# Patient Record
Sex: Male | Born: 1950
Health system: Southern US, Community
[De-identification: ages and names within clinical notes are randomized; demographics above are authoritative.]

## PROBLEM LIST (undated history)

## (undated) DIAGNOSIS — N529 Male erectile dysfunction, unspecified: Secondary | ICD-10-CM

## (undated) DIAGNOSIS — E785 Hyperlipidemia, unspecified: Secondary | ICD-10-CM

## (undated) DIAGNOSIS — K219 Gastro-esophageal reflux disease without esophagitis: Secondary | ICD-10-CM

## (undated) DIAGNOSIS — Z952 Presence of prosthetic heart valve: Secondary | ICD-10-CM

## (undated) DIAGNOSIS — G4733 Obstructive sleep apnea (adult) (pediatric): Secondary | ICD-10-CM

## (undated) DIAGNOSIS — K642 Third degree hemorrhoids: Secondary | ICD-10-CM

## (undated) DIAGNOSIS — I509 Heart failure, unspecified: Secondary | ICD-10-CM

## (undated) DIAGNOSIS — I4821 Permanent atrial fibrillation: Secondary | ICD-10-CM

## (undated) DIAGNOSIS — Z9989 Dependence on other enabling machines and devices: Secondary | ICD-10-CM

## (undated) DIAGNOSIS — I10 Essential (primary) hypertension: Secondary | ICD-10-CM

## (undated) HISTORY — PX: MITRAL VALVE REPAIR: SHX2039

## (undated) HISTORY — PX: MITRAL VALVE REPLACEMENT: SHX147

## (undated) HISTORY — PX: CARDIOVASCULAR STRESS TEST: SHX262

## (undated) HISTORY — DX: Male erectile dysfunction, unspecified: N52.9

## (undated) HISTORY — DX: Presence of prosthetic heart valve: Z95.2

## (undated) HISTORY — PX: KNEE ARTHROSCOPY: SUR90

## (undated) HISTORY — DX: Essential (primary) hypertension: I10

## (undated) HISTORY — DX: Gastro-esophageal reflux disease without esophagitis: K21.9

## (undated) HISTORY — PX: OTHER SURGICAL HISTORY: SHX169

## (undated) HISTORY — PX: TRANSTHORACIC ECHOCARDIOGRAM: SHX275

---
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
## 2001-05-03 HISTORY — PX: CARDIAC CATHETERIZATION: SHX172

## 2004-10-17 HISTORY — PX: CARDIOVERSION: SHX1299

## 2009-10-17 HISTORY — PX: COLONOSCOPY: SHX174

## 2011-03-31 LAB — COMPREHENSIVE METABOLIC PANEL
AST: 22 U/L
Glucose: 88
Potassium: 4.4 mmol/L
Sodium: 141 mmol/L (ref 137–147)

## 2011-03-31 LAB — LIPID PANEL
Cholesterol, Total: 179
HDL: 60 mg/dL (ref 35–70)
Triglycerides: 104

## 2011-03-31 LAB — PSA: PSA: 1.2 ng/mL

## 2011-03-31 LAB — CBC: platelet count: 263

## 2011-03-31 LAB — TSH: TSH: 2.05

## 2011-06-24 LAB — PROTIME-INR

## 2011-06-30 ENCOUNTER — Encounter: Payer: Self-pay | Admitting: Cardiology

## 2011-06-30 NOTE — Telephone Encounter (Signed)
Pt calling wanting to know what anti-acids pt can take w/ blood thinner. Please return pt call to discuss further.

## 2011-07-01 ENCOUNTER — Telehealth: Payer: Self-pay | Admitting: Cardiology

## 2011-07-01 ENCOUNTER — Emergency Department: Payer: Self-pay | Admitting: Emergency Medicine

## 2011-07-01 NOTE — Telephone Encounter (Signed)
Patient needs to see his primary care about abdominal pain Ian Bowen

## 2011-07-01 NOTE — Telephone Encounter (Signed)
Pt calling re new pt appt with [facility] 9-25, wants appt sooner, says having sob and pain in abdomen, dx shows ulcer in stomach and assuming that's where the pain is coming from I asked if any chest pain, said no, having trouble catching breath due to the pain, should he even be seen for this here???? Pt requesting call

## 2011-07-01 NOTE — Telephone Encounter (Signed)
MR Ian Bowen--a new pt who will see dr Jens Som 9/25 is calling stating he is having belly pain associated with bloating and SOB --He states he thinks it may be ulcer and what is best PPI for him to take OTC--i spoke with sally and she recommended pt take prilosec--this information given to pt--advised dr Jens Som does not have sooner appoint and since this will be first visit i felt he should see dr Jens Som for this appoint--i also asked sched to keep an eye on appoint list to see if any of dr Ludwig Clarks pts cancel--mr Strout can take that spot--mr Therriault agrees--in meantime if symptoms become worse he should proceed to nearest ED--pt agrees and michelle in sched will keep eye open for a cancelation

## 2011-07-04 NOTE — Telephone Encounter (Signed)
Spoke with pt, he was seen in the [place] ER with atrial fib with RVR. He was started on furosemide, potassium and diltiazem. He is feeling better since these meds were started. He has a hx of a. Fib after his valve surgery but was cardioverted and has remained in sinus since then(2005). Pt is leaving to go out of town wed this week. Pt aware dr Jens Som is not here before he would leave town and pt only wants to see dr Jens Som. Therefore we will get his records from [facility] when he was senn and I will discuss with dr Jens Som on wed morning. Pt is agreeable with this plan Deliah Goody

## 2011-07-04 NOTE — Telephone Encounter (Signed)
PT SEEN IN ER Friday NIGHT, SAID THEY WERE GOING TO CALL TO SAY HE NEEDED TO BE SEEN SOONER THAN 9-25, HEARTRATE 150 ON FIRDAY PM,  HAVING A-FIB SINCE Friday, pls call

## 2011-07-08 NOTE — Telephone Encounter (Signed)
Spoke with pt, dr Jens Som reviewed records from [facility]. Pt to follow up as scheduled Deliah Goody

## 2011-07-11 ENCOUNTER — Encounter: Payer: Self-pay | Admitting: Cardiology

## 2011-07-11 ENCOUNTER — Encounter: Payer: Self-pay | Admitting: *Deleted

## 2011-07-12 ENCOUNTER — Ambulatory Visit (INDEPENDENT_AMBULATORY_CARE_PROVIDER_SITE_OTHER): Payer: BC Managed Care – PPO | Admitting: *Deleted

## 2011-07-12 ENCOUNTER — Other Ambulatory Visit: Payer: Self-pay | Admitting: Cardiology

## 2011-07-12 ENCOUNTER — Ambulatory Visit (INDEPENDENT_AMBULATORY_CARE_PROVIDER_SITE_OTHER): Payer: BC Managed Care – PPO | Admitting: Cardiology

## 2011-07-12 ENCOUNTER — Encounter: Payer: Self-pay | Admitting: Cardiology

## 2011-07-12 ENCOUNTER — Ambulatory Visit (HOSPITAL_COMMUNITY): Payer: BC Managed Care – PPO | Attending: Cardiology | Admitting: Radiology

## 2011-07-12 DIAGNOSIS — I059 Rheumatic mitral valve disease, unspecified: Secondary | ICD-10-CM

## 2011-07-12 DIAGNOSIS — Z952 Presence of prosthetic heart valve: Secondary | ICD-10-CM

## 2011-07-12 DIAGNOSIS — R0989 Other specified symptoms and signs involving the circulatory and respiratory systems: Secondary | ICD-10-CM | POA: Insufficient documentation

## 2011-07-12 DIAGNOSIS — I4891 Unspecified atrial fibrillation: Secondary | ICD-10-CM

## 2011-07-12 DIAGNOSIS — I079 Rheumatic tricuspid valve disease, unspecified: Secondary | ICD-10-CM | POA: Insufficient documentation

## 2011-07-12 DIAGNOSIS — I379 Nonrheumatic pulmonary valve disorder, unspecified: Secondary | ICD-10-CM | POA: Insufficient documentation

## 2011-07-12 DIAGNOSIS — I10 Essential (primary) hypertension: Secondary | ICD-10-CM

## 2011-07-12 DIAGNOSIS — R1013 Epigastric pain: Secondary | ICD-10-CM

## 2011-07-12 DIAGNOSIS — M7989 Other specified soft tissue disorders: Secondary | ICD-10-CM | POA: Insufficient documentation

## 2011-07-12 DIAGNOSIS — I5031 Acute diastolic (congestive) heart failure: Secondary | ICD-10-CM

## 2011-07-12 DIAGNOSIS — I509 Heart failure, unspecified: Secondary | ICD-10-CM

## 2011-07-12 DIAGNOSIS — Z7901 Long term (current) use of anticoagulants: Secondary | ICD-10-CM

## 2011-07-12 DIAGNOSIS — Z954 Presence of other heart-valve replacement: Secondary | ICD-10-CM

## 2011-07-12 DIAGNOSIS — E785 Hyperlipidemia, unspecified: Secondary | ICD-10-CM | POA: Insufficient documentation

## 2011-07-12 DIAGNOSIS — R0609 Other forms of dyspnea: Secondary | ICD-10-CM | POA: Insufficient documentation

## 2011-07-12 LAB — CBC WITH DIFFERENTIAL/PLATELET
Eosinophils Relative: 2.4 % (ref 0.0–5.0)
HCT: 47.1 % (ref 39.0–52.0)
Hemoglobin: 15.6 g/dL (ref 13.0–17.0)
Lymphs Abs: 1.2 10*3/uL (ref 0.7–4.0)
MCV: 101.5 fl — ABNORMAL HIGH (ref 78.0–100.0)
Monocytes Absolute: 0.5 10*3/uL (ref 0.1–1.0)
Neutro Abs: 4.5 10*3/uL (ref 1.4–7.7)
Platelets: 217 10*3/uL (ref 150.0–400.0)
RDW: 14.1 % (ref 11.5–14.6)
WBC: 6.4 10*3/uL (ref 4.5–10.5)

## 2011-07-12 LAB — BASIC METABOLIC PANEL
BUN: 16 mg/dL (ref 6–23)
CO2: 25 mEq/L (ref 19–32)
Chloride: 107 mEq/L (ref 96–112)
Creatinine, Ser: 0.9 mg/dL (ref 0.4–1.5)

## 2011-07-12 LAB — POCT INR: INR: 3.5

## 2011-07-12 MED ORDER — FUROSEMIDE 20 MG PO TABS: 20.0000 mg | ORAL_TABLET | Freq: Two times a day (BID) | ORAL | Status: DC

## 2011-07-12 MED ORDER — POTASSIUM CHLORIDE 10 MEQ PO TBCR: 10.0000 meq | EXTENDED_RELEASE_TABLET | Freq: Two times a day (BID) | ORAL | Status: DC

## 2011-07-12 NOTE — Patient Instructions (Addendum)
Your physician recommends that you schedule a follow-up appointment in: 1 WEEK  INCREASE FUROSEMIDE TO 20 MG ONE TABLET TWICE DAILY  Your physician recommends that you return for lab work in: TODAY AND IN ONE WEEK  INCREASE POTASSIUM 20 MEQ ONE TABLET TWICE DAILY  Your physician has requested that you have an echocardiogram. Echocardiography is a painless test that uses sound waves to create images of your heart. It provides your doctor with information about the size and shape of your heart and how well your heart's chambers and valves are working. This procedure takes approximately one hour. There are no restrictions for this procedure.   ESTABLISH WITH PRIMARY CARE AT [facility]  Your physician has requested that you have a lexiscan myoview. For further information please visit [email or web address] Please follow instruction sheet, as given.     INCREASE LISINOPRIL TO 2.5 MG ONCE DAILY

## 2011-07-12 NOTE — Assessment & Plan Note (Addendum)
Patient has new-onset congestive heart failure. He has worsening dyspnea on exertion, orthopnea and pedal edema. His symptoms did improve with the addition of Cardizem and low-dose Lasix. He still has excess volume. Check CBC, potassium and renal function. Increase Lasix to 40 mg daily and repeat bmet in one week. Plan echocardiogram to assess LV function and mitral valve. Certainly atrial fibrillation could be contributing to his heart failure symptoms. I will obtain records from his previous physician in [place]. If his atrial fibrillation is new we will proceed with cardioversion to see if this helps with his CHF symptoms. We will need to obtain records concerning his Coumadin as well before proceeding.  Addendum: Preliminary results of echocardiogram performed in the office today showed severe LV dysfunction. Etiology unclear. Proceed with Myoview screening for coronary disease. There may be a component related to alcohol use. Patient now states he consumes approximately one and 1/2-2 bottles of wine per evening. I've asked him to abstain from alcohol. Certainly atrial fibrillation with associated tachycardia could be causing tachycardia mediated cardiomyopathy. We will await records. If he has been in long-term atrial fibrillation I will plan a monitor to make sure that his rate is controlled. Otherwise we may consider attempt at cardioversion. Note the left atrium appeared to be severely enlarged on preliminary review of his echo and therefore maintaining sinus rhythm may be difficult. I have increased his lisinopril to 2.5 mg daily. Plan check potassium and renal function in one week. Followup with me in one week.

## 2011-07-12 NOTE — Progress Notes (Signed)
Addended by: Early Chars on: 07/12/2011 05:11 PM   Modules accepted: Orders

## 2011-07-12 NOTE — Assessment & Plan Note (Signed)
Continue statin. Note recent liver functions elevated. Patient consumes a bottle of wine per evening. This may be contributing to his increased liver functions. I have asked him to decrease his alcohol intake. Repeat liver functions in the future.

## 2011-07-12 NOTE — Assessment & Plan Note (Signed)
Patient in atrial fibrillation which may be relatively new in onset and contributing to heart failure. Continue Cardizem and metoprolol. Continue Coumadin. Patient will be seen in our Coumadin clinic long-term. Reviewed outside records and proceed with cardioversion if atrial fibrillation.

## 2011-07-12 NOTE — Progress Notes (Signed)
HPI: 60 year old male for establishment. Patient underwent mitral valve replacement with a [employer] bileaflet valve in September of 2002. Note preoperative cardiac catheterization he showed normal coronary arteries and normal LV function. He apparently had subsequent repair in 2006 due to paravalvular leak. Transesophageal echocardiogram in May 2009 showed normally functioning mechanical mitral valve with no leak. LV function was normal. Patient also has a history of paroxysmal atrial fibrillation. He was seen at [facility] in September of 2012 chest with an episode of atrial fibrillation. TSH was normal. LFTs were mildly elevated. Patient recently moved from [place]. Presents to establish. Patient states that he has noticed increased dyspnea on exertion for the past one month. There was also orthopnea and PND. There was also pedal edema. No chest pain, palpitations or syncope. He does have epigastric discomfort with standing; also with exertion. When he was seen at [facility] his heart rate was 150. The patient was felt to also have congestive heart failure. Cardizem was added to his medical regimen as was Lasix and potassium. There has been some improvement in his symptoms since. He was told in May by his primary care physician that he was in atrial fibrillation which apparently was new.   Current Outpatient Prescriptions  Medication Sig Dispense Refill  . aspirin 81 MG tablet Take 81 mg by mouth daily.        Marland Kitchen diltiazem (CARDIZEM CD) 180 MG 24 hr capsule Take 180 mg by mouth daily.        Marland Kitchen lovastatin (MEVACOR) 20 MG tablet Take 20 mg by mouth at bedtime.        . metoprolol (TOPROL-XL) 50 MG 24 hr tablet Take 50 mg by mouth daily.        Marland Kitchen omeprazole (PRILOSEC) 10 MG capsule Take 10 mg by mouth daily.        . tadalafil (CIALIS) 5 MG tablet Take 5 mg by mouth daily.        Marland Kitchen warfarin (COUMADIN) 7.5 MG tablet Take 7.5 mg by mouth. As directed       . furosemide (LASIX) 20 MG tablet Take 20 mg  by mouth daily.        . potassium chloride (KLOR-CON) 10 MEQ CR tablet Take 10 mEq by mouth daily.          No Known Allergies  Past Medical History  Diagnosis Date  . Dyslipidemia   . S/P MVR (mitral valve replacement)     [facility] in [place]  . Malfunction of mitral prosthetic valve     Paravalvular leak repaired in [place]  . HTN (hypertension)   . PAF (paroxysmal atrial fibrillation)   . Mitral valve prolapse     Past Surgical History  Procedure Date  . Mitral valve replacement   . Cardioversion   . Right knee surgery   . Left arthroscopic knee surgery   . Amputation finger / thumb     History   Social History  . Marital Status: Single    Spouse Name: N/A    Number of Children: 2  . Years of Education: N/A   Occupational History  .  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 Smoking status: Never Smoker   . Smokeless tobacco: Not on file  . Alcohol Use: Yes     Bottle wine per night  . Drug Use: Not on file  . Sexually Active: Not on file   Other Topics Concern  . Not on file  Social History Narrative  . No narrative on file    Family History  Problem Relation Age of Onset  . Heart attack Father     died of MI at age 50    ROS: no fevers or chills, productive cough, hemoptysis, dysphasia, odynophagia, melena, hematochezia, dysuria, hematuria, rash, seizure activity, orthopnea, PND, pedal edema, claudication. Remaining systems are negative.  Physical Exam: General:  Well developed/well nourished in NAD Skin warm/dry Patient not depressed No peripheral clubbing Back-normal HEENT-normal/normal eyelids Neck supple/normal carotid upstroke bilaterally; no bruits; no JVD; no thyromegaly chest - CTA/ normal expansion CV - Irregular/normal S1 and S2; no murmurs, rubs or gallops;  PMI nondisplaced; crisp mechanical valvel Abdomen -NT/ND, no HSM, no mass, + bowel sounds, no bruit 2+ femoral pulses, no bruits Ext-no edema, chords, 2+  DP Neuro-grossly nonfocal  ECG atrial fibrillation at a rate of 82. Anterior TWI; possible lateral MI.

## 2011-07-12 NOTE — Progress Notes (Signed)
Addended by: Freddi Starr on: 07/12/2011 03:18 PM   Modules accepted: Orders

## 2011-07-12 NOTE — Assessment & Plan Note (Signed)
Continue present blood pressure medications. 

## 2011-07-12 NOTE — Assessment & Plan Note (Signed)
Patient states there has been some improvement with Prilosec. However he also states the symptoms can increase with exertion and there is anterior T-wave inversion on electrocardiogram. Will plan myoview to exclude coronary disease. Note catheterization prior to mitral valve surgery showed normal coronary arteries but this was 10 years ago.

## 2011-07-12 NOTE — Assessment & Plan Note (Signed)
Continued SBE prophylaxis and repeat echocardiogram.

## 2011-07-13 ENCOUNTER — Other Ambulatory Visit (INDEPENDENT_AMBULATORY_CARE_PROVIDER_SITE_OTHER): Payer: BC Managed Care – PPO | Admitting: *Deleted

## 2011-07-13 ENCOUNTER — Other Ambulatory Visit: Payer: Self-pay | Admitting: *Deleted

## 2011-07-13 DIAGNOSIS — I10 Essential (primary) hypertension: Secondary | ICD-10-CM

## 2011-07-13 DIAGNOSIS — I5031 Acute diastolic (congestive) heart failure: Secondary | ICD-10-CM

## 2011-07-13 DIAGNOSIS — I509 Heart failure, unspecified: Secondary | ICD-10-CM

## 2011-07-13 DIAGNOSIS — E875 Hyperkalemia: Secondary | ICD-10-CM

## 2011-07-13 DIAGNOSIS — Z952 Presence of prosthetic heart valve: Secondary | ICD-10-CM

## 2011-07-13 DIAGNOSIS — E785 Hyperlipidemia, unspecified: Secondary | ICD-10-CM

## 2011-07-13 DIAGNOSIS — I059 Rheumatic mitral valve disease, unspecified: Secondary | ICD-10-CM

## 2011-07-13 DIAGNOSIS — I4891 Unspecified atrial fibrillation: Secondary | ICD-10-CM

## 2011-07-13 DIAGNOSIS — Z954 Presence of other heart-valve replacement: Secondary | ICD-10-CM

## 2011-07-13 DIAGNOSIS — R1013 Epigastric pain: Secondary | ICD-10-CM

## 2011-07-13 LAB — PROTIME-INR: Prothrombin Time: 36 seconds — ABNORMAL HIGH (ref 11.6–15.2)

## 2011-07-13 LAB — BASIC METABOLIC PANEL
BUN: 20 mg/dL (ref 6–23)
Chloride: 106 mEq/L (ref 96–112)
Creatinine, Ser: 1 mg/dL (ref 0.4–1.5)
Glucose, Bld: 67 mg/dL — ABNORMAL LOW (ref 70–99)
Potassium: 5.2 mEq/L — ABNORMAL HIGH (ref 3.5–5.1)

## 2011-07-14 ENCOUNTER — Telehealth: Payer: Self-pay | Admitting: Cardiology

## 2011-07-14 ENCOUNTER — Ambulatory Visit (HOSPITAL_COMMUNITY): Payer: BC Managed Care – PPO | Attending: Cardiology | Admitting: Radiology

## 2011-07-14 DIAGNOSIS — Z952 Presence of prosthetic heart valve: Secondary | ICD-10-CM

## 2011-07-14 DIAGNOSIS — I509 Heart failure, unspecified: Secondary | ICD-10-CM | POA: Insufficient documentation

## 2011-07-14 DIAGNOSIS — Z954 Presence of other heart-valve replacement: Secondary | ICD-10-CM | POA: Insufficient documentation

## 2011-07-14 DIAGNOSIS — R0789 Other chest pain: Secondary | ICD-10-CM

## 2011-07-14 DIAGNOSIS — I059 Rheumatic mitral valve disease, unspecified: Secondary | ICD-10-CM | POA: Insufficient documentation

## 2011-07-14 DIAGNOSIS — R1013 Epigastric pain: Secondary | ICD-10-CM | POA: Insufficient documentation

## 2011-07-14 DIAGNOSIS — I4891 Unspecified atrial fibrillation: Secondary | ICD-10-CM | POA: Insufficient documentation

## 2011-07-14 DIAGNOSIS — I10 Essential (primary) hypertension: Secondary | ICD-10-CM | POA: Insufficient documentation

## 2011-07-14 DIAGNOSIS — R0609 Other forms of dyspnea: Secondary | ICD-10-CM

## 2011-07-14 DIAGNOSIS — I5031 Acute diastolic (congestive) heart failure: Secondary | ICD-10-CM | POA: Insufficient documentation

## 2011-07-14 DIAGNOSIS — E785 Hyperlipidemia, unspecified: Secondary | ICD-10-CM | POA: Insufficient documentation

## 2011-07-14 MED ORDER — TECHNETIUM TC 99M TETROFOSMIN IV KIT
11.0000 | PACK | Freq: Once | INTRAVENOUS | Status: AC | PRN
  Administered 2011-07-14: 11 via INTRAVENOUS

## 2011-07-14 MED ORDER — TECHNETIUM TC 99M TETROFOSMIN IV KIT
33.0000 | PACK | Freq: Once | INTRAVENOUS | Status: AC | PRN
  Administered 2011-07-14: 33 via INTRAVENOUS

## 2011-07-14 MED ORDER — REGADENOSON 0.4 MG/5ML IV SOLN
0.4000 mg | Freq: Once | INTRAVENOUS | Status: AC
  Administered 2011-07-14: 0.4 mg via INTRAVENOUS

## 2011-07-14 NOTE — Progress Notes (Signed)
[facility] SITE 3 NUCLEAR MED [address] [place] [address] [phone]  Cardiology Nuclear Med Study  Ian Bowen is a 60 y.o. male [id number] [birth year]   Nuclear Med Background Indication for Stress Test:  Evaluation for Ischemia, 07[facility]/11 [facility] ([facility] with CP and Abnormal EKG History:  MVP and '02 Cath:normal Coronaries>MV Replacement; '06 MV Repair; 07/12/11 Echo:EF=20%; H/O atrial fibrillation Cardiac Risk Factors: Family History - CAD, Hypertension and Lipids  Symptoms:  Epigastric Pressure with and without Exertion (now 2-3/10), Dizziness, DOE, Fatigue and Nausea   Nuclear Pre-Procedure Caffeine/Decaff Intake:  None NPO After: 8:00am   Lungs:  Clear.  O2 SAT 98% on RA IV 0.9% NS with Angio Cath:  20g  IV Site: R Wrist  IV Started by:  Stanton Kidney, EMT-P  Chest Size (in):  42 Cup Size: n/a  Height: 5\' 11"  (1.803 m)  Weight:  189 lb (85.73 kg)  BMI:  Body mass index is 26.36 kg/(m^2). Tech Comments:  Toprol held > 24 hours, per patient    Nuclear Med Study 1 or 2 day study: 1 day  Stress Test Type:  Treadmill/Lexiscan  Reading MD: Cassell Clement, MD  Order Authorizing Provider:  Olga Millers, MD  Resting Radionuclide: Technetium 25m Tetrofosmin  Resting Radionuclide Dose: 11.0 mCi   Stress Radionuclide:  Technetium 77m Tetrofosmin  Stress Radionuclide Dose: 33.0 mCi           Stress Protocol Rest HR: 85 Stress HR: 141  Rest BP: 95/70 Stress BP: 121/84  Exercise Time (min): 2:00 METS: n/a   Predicted Max HR: 161 bpm % Max HR: 88.2 bpm Rate Pressure Product: 69629   Dose of Adenosine (mg):  n/a Dose of Lexiscan: 0.4 mg  Dose of Atropine (mg): n/a Dose of Dobutamine: n/a mcg/kg/min (at max HR)  Stress Test Technologist: Smiley Houseman, CMA-N  Nuclear Technologist:  Domenic Polite, CNMT     Rest Procedure:  Myocardial perfusion imaging was performed at rest 45 minutes following the intravenous administration of Technetium 53m  Tetrofosmin.  Rest ECG: Atrial Fibrilliation with nonspecific T-wave changes and occasional PVC's.  Stress Procedure:  The patient received IV Lexiscan 0.4 mg over 15-seconds with concurrent low level exercise and then Technetium 64m Tetrofosmin was injected at 30-seconds while the patient continued walking one more minute.  There were no significant changes with Lexiscan, only occasional PVC's.  Quantitative spect images were obtained after a 45-minute delay.  Stress ECG: No significant change from baseline ECG  QPS Raw Data Images:  Normal; no motion artifact; normal heart/lung ratio. Stress Images:  Large inferoapical defect. Rest Images:  Minimal partial reversibility of apical defect with persistence of inferior defect Subtraction (SDS):  These findings are consistent with inferoapical scar with minimal reversible ischemia. Transient Ischemic Dilatation (Normal <1.22):  1.01 Lung/Heart Ratio (Normal <0.45):  0.43  Quantitative Gated Spect Images QGS EDV:  269 ml QGS ESV:  221 ml QGS cine images:  Marked cardiomegaly with severe global hypokinesis QGS EF: 18%  Impression Exercise Capacity:  Lexiscan with low level exercise. BP Response:  Hypotensive blood pressure response. Clinical Symptoms:  No chest pain. ECG Impression:  No significant ST segment change suggestive of ischemia. Comparison with Prior Nuclear Study: No previous nuclear study performed  Overall Impression:  Abnormal stress nuclear study.  Severe LV systolic dysfunction with inferoapical scar with minimal reversibility.  Cassell Clement

## 2011-07-14 NOTE — Telephone Encounter (Signed)
Pt has questions about whether or not to take his Potassium pills.  Please call pt back for medical advice.

## 2011-07-14 NOTE — Telephone Encounter (Signed)
Spoke with pt, he was told not take potassium at this time Ian Bowen

## 2011-07-18 ENCOUNTER — Telehealth: Payer: Self-pay | Admitting: Cardiology

## 2011-07-18 NOTE — Telephone Encounter (Addendum)
ROI Faxed to [facility] to obtain last 4 months of Lipids  [phone]  07/18/11/km  Lipids received from [facility] sent around to [facility] 07/19/11/km

## 2011-07-18 NOTE — Telephone Encounter (Signed)
error 

## 2011-07-19 ENCOUNTER — Encounter: Payer: Self-pay | Admitting: Cardiology

## 2011-07-19 ENCOUNTER — Encounter: Payer: Self-pay | Admitting: *Deleted

## 2011-07-20 ENCOUNTER — Ambulatory Visit (INDEPENDENT_AMBULATORY_CARE_PROVIDER_SITE_OTHER): Payer: BC Managed Care – PPO | Admitting: Family Medicine

## 2011-07-20 ENCOUNTER — Other Ambulatory Visit: Payer: BC Managed Care – PPO | Admitting: *Deleted

## 2011-07-20 ENCOUNTER — Encounter: Payer: BC Managed Care – PPO | Admitting: *Deleted

## 2011-07-20 ENCOUNTER — Encounter: Payer: Self-pay | Admitting: Family Medicine

## 2011-07-20 VITALS — BP 118/72 | HR 80 | Temp 98.5°F | Ht 69.5 in | Wt 185.8 lb

## 2011-07-20 DIAGNOSIS — Z952 Presence of prosthetic heart valve: Secondary | ICD-10-CM

## 2011-07-20 DIAGNOSIS — R1013 Epigastric pain: Secondary | ICD-10-CM

## 2011-07-20 DIAGNOSIS — I10 Essential (primary) hypertension: Secondary | ICD-10-CM

## 2011-07-20 DIAGNOSIS — E785 Hyperlipidemia, unspecified: Secondary | ICD-10-CM

## 2011-07-20 DIAGNOSIS — I509 Heart failure, unspecified: Secondary | ICD-10-CM

## 2011-07-20 DIAGNOSIS — K219 Gastro-esophageal reflux disease without esophagitis: Secondary | ICD-10-CM

## 2011-07-20 DIAGNOSIS — I4891 Unspecified atrial fibrillation: Secondary | ICD-10-CM

## 2011-07-20 DIAGNOSIS — Z954 Presence of other heart-valve replacement: Secondary | ICD-10-CM

## 2011-07-20 NOTE — Progress Notes (Signed)
Subjective:    Patient ID: Ian Bowen, male    DOB: [birth year], 60 y.o.   MRN: [id number]  HPI CC: new pt establish  H/o mitral valve replaced 2002, leaking 2006 s/p repair in [place].  Recent ER visit 2 wks ago for SOB, tachycardia, fluid around heart and lungs.  On recent echo found to have sCHF with EF 20%, dilated LV, mild pHTN.  Currently no SOB, PNDyspnea, orthopnea, chest pain/tightness, dizziness, HA.  Weight tends to be 180lbs.  Lots of stress recently - short selling 3 homes, may be foreclosing 4th, going through divorce.  H/o bleeding hemorrhoids in past.  Last noted yesterday, improving.  Worse with coumadin.  Colonoscopy last year in [place], [place], normal, per pt rpt due in 10 years.  Cardiologist is Dr. Jens Som.  Preventative: Unsure last tetanus.  Flu shot - declines.  Never had pneumonia shot. Colonoscopy 2011, normal. Normal prostate screening. 03/01/2011 last CPE with blood work.  Medications and allergies reviewed and updated in chart.  Past histories reviewed and updated if relevant as below. Patient Active Problem List  Diagnoses  . CHF (congestive heart failure)  . S/P mitral valve replacement  . Atrial fibrillation  . Hypertension  . Hyperlipidemia  . Epigastric pain  . Encounter for long-term (current) use of anticoagulants   Past Medical History  Diagnosis Date  . Dyslipidemia   . S/P MVR (mitral valve replacement) [facility] in [place]  . Malfunction of mitral prosthetic valve     Paravalvular leak repaired in [place]  . HTN (hypertension)   . PAF (paroxysmal atrial fibrillation)   . Mitral valve prolapse     s/p replacement  . GERD (gastroesophageal reflux disease)    Past Surgical History  Procedure Date  . Mitral valve replacement 2002    with MVR repair 2006 for leaking valve  . Cardioversion 2006  . Right knee surgery   . Left arthroscopic knee surgery   . Amputation finger / thumb 1969    left ring finger  .  2d echo 07/12/2011    Normal LV size with severe global hypokinesis, EF 20%., mild pHTN, mechanical mitral valve functioning normally   History  Substance Use Topics  . Smoking status: Never Smoker   . Smokeless tobacco: Never Used  . Alcohol Use: No     previously wine/beer (bottle wine nightly).  stopped with results of echo   Family History  Problem Relation Age of Onset  . Heart attack Father     died of MI at age 56  . Coronary artery disease Father   . Cancer Mother     spleen cancer  . Coronary artery disease Paternal Uncle   . Coronary artery disease Paternal Grandfather   . Stroke Neg Hx   . Diabetes Neg Hx    No Known Allergies Current Outpatient Prescriptions on File Prior to Visit  Medication Sig Dispense Refill  . aspirin 81 MG tablet Take 81 mg by mouth daily.        Marland Kitchen diltiazem (CARDIZEM CD) 180 MG 24 hr capsule Take 180 mg by mouth daily.        . furosemide (LASIX) 20 MG tablet Take 1 tablet (20 mg total) by mouth 2 (two) times daily.  60 tablet  12  . lisinopril (ZESTRIL) 2.5 MG tablet Take 1 tablet (2.5 mg total) by mouth daily.      Marland Kitchen lovastatin (MEVACOR) 20 MG tablet Take 20 mg by mouth  at bedtime.        . metoprolol (TOPROL-XL) 50 MG 24 hr tablet Take 50 mg by mouth daily.        Marland Kitchen omeprazole (PRILOSEC) 10 MG capsule Take 10 mg by mouth daily.        . tadalafil (CIALIS) 5 MG tablet Take 5 mg by mouth daily.        Marland Kitchen warfarin (COUMADIN) 7.5 MG tablet Take 7.5 mg by mouth. As directed       . potassium chloride (KLOR-CON) 10 MEQ CR tablet Take 1 tablet (10 mEq total) by mouth 2 (two) times daily.  60 tablet  12   Review of Systems  Constitutional: Negative for fever, chills, activity change, appetite change, fatigue and unexpected weight change.  HENT: Negative for hearing loss and neck pain.   Eyes: Negative for visual disturbance.  Respiratory: Positive for cough and shortness of breath. Negative for chest tightness and wheezing.   Cardiovascular:  Positive for palpitations and leg swelling. Negative for chest pain.  Gastrointestinal: Positive for abdominal pain (improved with prilosec) and blood in stool (bleeding hemorrhoids). Negative for nausea, vomiting, diarrhea, constipation and abdominal distention.  Genitourinary: Negative for hematuria and difficulty urinating.  Musculoskeletal: Negative for myalgias and arthralgias.  Skin: Negative for rash.  Neurological: Negative for dizziness, seizures, syncope and headaches.  Hematological: Bruises/bleeds easily (on coumadin).  Psychiatric/Behavioral: Negative for dysphoric mood. The patient is not nervous/anxious.        Objective:   Physical Exam  Nursing note and vitals reviewed. Constitutional: He is oriented to person, place, and time. He appears well-developed and well-nourished. No distress.  HENT:  Head: Normocephalic and atraumatic.  Right Ear: External ear normal.  Left Ear: External ear normal.  Nose: Nose normal.  Mouth/Throat: Oropharynx is clear and moist. No oropharyngeal exudate.  Eyes: Conjunctivae and EOM are normal. Pupils are equal, round, and reactive to light. No scleral icterus.  Neck: Normal range of motion. Neck supple. JVD present.  Cardiovascular: Normal rate and intact distal pulses.  An irregularly irregular rhythm present.  No murmur heard. Pulses:      Radial pulses are 2+ on the right side, and 2+ on the left side.       Mechanical click  Pulmonary/Chest: Effort normal and breath sounds normal. No respiratory distress. He has no wheezes. He has no rales.  Abdominal: Soft. Bowel sounds are normal. He exhibits no distension and no mass. There is no tenderness. There is no rebound and no guarding.  Musculoskeletal: Normal range of motion. He exhibits no edema (no significant pitting edema).  Lymphadenopathy:    He has no cervical adenopathy.  Neurological: He is alert and oriented to person, place, and time.       CN grossly intact, station and gait  intact  Skin: Skin is warm and dry. No rash noted.  Psychiatric: He has a normal mood and affect. His behavior is normal. Judgment and thought content normal.          Assessment & Plan:

## 2011-07-20 NOTE — Assessment & Plan Note (Signed)
Await f/u labwork.  Have requested records from prior PCP.  Continue lovastatin.

## 2011-07-20 NOTE — Assessment & Plan Note (Signed)
Great control on current meds.   Requested records, continue current regimen.

## 2011-07-20 NOTE — Assessment & Plan Note (Signed)
Continue prilosec

## 2011-07-20 NOTE — Assessment & Plan Note (Signed)
In afib today, rate controlled with addition of cardizem. Pt using B blocker in am, CCB at night. Continue regimen per cards.

## 2011-07-20 NOTE — Assessment & Plan Note (Signed)
On coumadin 

## 2011-07-20 NOTE — Assessment & Plan Note (Signed)
Recent myoview negative for ischemia. Likely gerd.  improved on low dose prilosec.  continue.

## 2011-07-20 NOTE — Assessment & Plan Note (Addendum)
Systolic CHF with EF 20%, dilated CM.  ?EtOH contribution.  Pt has stopped all EtOH for last several weeks. Improved edema, DOE, orthopnea, no further PNDyspnea. Continues to hold K 2/2 mild hyperkalemia last check. Continue current regimen per cards.

## 2011-07-20 NOTE — Patient Instructions (Addendum)
Good to meet you today. Return as needed or next year for physical. I will see what cardiology plan is.

## 2011-07-21 ENCOUNTER — Other Ambulatory Visit (INDEPENDENT_AMBULATORY_CARE_PROVIDER_SITE_OTHER): Payer: BC Managed Care – PPO | Admitting: *Deleted

## 2011-07-21 ENCOUNTER — Encounter: Payer: Self-pay | Admitting: Cardiology

## 2011-07-21 ENCOUNTER — Ambulatory Visit (INDEPENDENT_AMBULATORY_CARE_PROVIDER_SITE_OTHER): Payer: BC Managed Care – PPO | Admitting: Cardiology

## 2011-07-21 DIAGNOSIS — I4891 Unspecified atrial fibrillation: Secondary | ICD-10-CM

## 2011-07-21 DIAGNOSIS — I10 Essential (primary) hypertension: Secondary | ICD-10-CM

## 2011-07-21 DIAGNOSIS — I429 Cardiomyopathy, unspecified: Secondary | ICD-10-CM | POA: Insufficient documentation

## 2011-07-21 DIAGNOSIS — E875 Hyperkalemia: Secondary | ICD-10-CM

## 2011-07-21 LAB — BASIC METABOLIC PANEL
CO2: 26 mEq/L (ref 19–32)
Calcium: 8.8 mg/dL (ref 8.4–10.5)
Creatinine, Ser: 1.1 mg/dL (ref 0.4–1.5)
GFR: 72.67 mL/min (ref >60.00)
Glucose, Bld: 73 mg/dL (ref 70–99)
Sodium: 138 mEq/L (ref 135–145)

## 2011-07-21 MED ORDER — DIGOXIN 125 MCG PO TABS: 125.0000 ug | ORAL_TABLET | Freq: Every day | ORAL | Status: DC

## 2011-07-21 MED ORDER — METOPROLOL SUCCINATE ER 50 MG PO TB24: 50.0000 mg | ORAL_TABLET | Freq: Two times a day (BID) | ORAL | Status: DC

## 2011-07-21 NOTE — Assessment & Plan Note (Signed)
Volume status much improved. Check potassium and renal function.

## 2011-07-21 NOTE — Assessment & Plan Note (Signed)
Continue present blood pressure medications. 

## 2011-07-21 NOTE — Patient Instructions (Addendum)
Your physician has recommended that you wear a 48 hour holter monitor. Holter monitors are medical devices that record the heart's electrical activity. Doctors most often use these monitors to diagnose arrhythmias. Arrhythmias are problems with the speed or rhythm of the heartbeat. The monitor is a small, portable device. You can wear one while you do your normal daily activities. This is usually used to diagnose what is causing palpitations/syncope (passing out).   Your physician has recommended you make the following change in your medication:  Increase Toprol Start Digoxin Stop Cardizem  Your physician recommends that you schedule a follow-up appointment in: 2 weeks with Dr. Jens Som  Your physician recommends that you have lab work today  Bmp

## 2011-07-21 NOTE — Assessment & Plan Note (Signed)
Duration unclear but he was told he was in atrial fibrillation in May of 2012. His left atrium is enlarged. He will also require Coumadin for his mitral valve replacement. Therefore plan rate control and anticoagulations.

## 2011-07-21 NOTE — Assessment & Plan Note (Signed)
Continued SBE prophylaxis. 

## 2011-07-21 NOTE — Progress Notes (Signed)
HPI:60 year old male for fu of CHF and atrial fibrilllation. Patient underwent mitral valve replacement with a [employer] bileaflet valve in September of 2002. Note preoperative cardiac catheterization showed normal coronary arteries and normal LV function. He apparently had subsequent repair in 2006 due to paravalvular leak. Transesophageal echocardiogram in May 2009 showed normally functioning mechanical mitral valve with no leak. LV function was normal. Patient also has a history of paroxysmal atrial fibrillation. He was seen at [facility] in September of 2012 with an episode of atrial fibrillation. TSH was normal. LFTs were mildly elevated.  When he was seen at [facility] his heart rate was 150. The patient was felt to also have congestive heart failure. Cardizem was added to his medical regimen as was Lasix and potassium. There has been some improvement in his symptoms. When I saw him previously we increased his Lasix. A Myoview was performed in September of 2012 and showed inferior apical infarct with minimal reversibility. Ejection fraction 18%. Echocardiogram in September of 2012 showed an ejection fraction of 20%, diffuse hypokinesis, normally functioning prosthetic mitral valve, biatrial enlargement, right ventricular enlargement with decreased function. Since I last saw him, his dyspnea is much improved. No dyspnea on exertion, orthopnea, PND, pedal edema, syncope or chest pain. No palpitations.   Current Outpatient Prescriptions  Medication Sig Dispense Refill  . aspirin 81 MG tablet Take 81 mg by mouth daily.        Marland Kitchen diltiazem (CARDIZEM CD) 180 MG 24 hr capsule Take 180 mg by mouth daily.        . furosemide (LASIX) 20 MG tablet Take 1 tablet (20 mg total) by mouth 2 (two) times daily.  60 tablet  12  . lisinopril (ZESTRIL) 2.5 MG tablet Take 1 tablet (2.5 mg total) by mouth daily.      Marland Kitchen lovastatin (MEVACOR) 20 MG tablet Take 20 mg by mouth at bedtime.        . metoprolol (TOPROL-XL)  50 MG 24 hr tablet Take 50 mg by mouth daily.        Marland Kitchen omeprazole (PRILOSEC) 10 MG capsule Take 10 mg by mouth daily.        . tadalafil (CIALIS) 5 MG tablet Take 5 mg by mouth daily.        Marland Kitchen warfarin (COUMADIN) 7.5 MG tablet Take 7.5 mg by mouth. As directed          Past Medical History  Diagnosis Date  . Dyslipidemia   . S/P MVR (mitral valve replacement) [facility] in [place]  . Malfunction of mitral prosthetic valve     Paravalvular leak repaired in [place]  . HTN (hypertension)   . PAF (paroxysmal atrial fibrillation)   . Mitral valve prolapse     s/p replacement  . GERD (gastroesophageal reflux disease)     Past Surgical History  Procedure Date  . Mitral valve replacement 2002    with MVR repair 2006 for leaking valve  . Cardioversion 2006  . Right knee surgery   . Left arthroscopic knee surgery   . Amputation finger / thumb 1969    left ring finger  . 2d echo 07/12/2011    Normal LV size with severe global hypokinesis, EF 20%., mild pHTN, mechanical mitral valve functioning normally    History   Social History  . Marital Status: Single    Spouse Name: N/A    Number of Children: 2  . Years of Education: N/A   Occupational History  .  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 Smoking status: Never Smoker   . Smokeless tobacco: Never Used  . Alcohol Use: No     previously wine/beer (bottle wine nightly).  stopped with results of echo  . Drug Use: No  . Sexually Active: Not on file   Other Topics Concern  . 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    ROS: no fevers or chills, productive cough, hemoptysis, dysphasia, odynophagia, melena, hematochezia, dysuria, hematuria, rash, seizure activity, orthopnea, PND, pedal edema, claudication. Remaining systems are  negative.  Physical Exam: Well-developed well-nourished in no acute distress.  Skin is warm and dry.  HEENT is normal.  Neck is supple. No thyromegaly.  Chest is clear to auscultation with normal expansion.  Cardiovascular exam is irregular, crisp valve sound. Abdominal exam nontender or distended. No masses palpated. Extremities show no edema. neuro grossly intact

## 2011-07-21 NOTE — Assessment & Plan Note (Signed)
Continue statin. 

## 2011-07-21 NOTE — Assessment & Plan Note (Signed)
Monitored in the Coumadin clinic.

## 2011-07-21 NOTE — Assessment & Plan Note (Signed)
Patient has a new cardiomyopathy with etiology unclear. I have reviewed his Myoview. A prior inferior apical infarct cannot be excluded. However the other walls are perfused normally. I think it is more consistent with a nonischemic cardiomyopathy. He has discontinued his alcohol use which could have contributed. I think more likely that atrial fibrillation with elevated heart rate caused a tachycardia mediated cardiomyopathy. His rate has improved. However given his reduced LV function I will change his Toprol to 50 mg p.o. B.i.d. And add digoxin 0.125 mg daily. Discontinue Cardizem. Place CardioNet monitor heart rate. We will adjust his regimen to make sure that his rate is controlled. We will then plan repeat echocardiogram in 3 months. Hopefully his LV function will have normalized. If not he may require cardiac catheterization and consideration of ICD.

## 2011-07-22 ENCOUNTER — Encounter (INDEPENDENT_AMBULATORY_CARE_PROVIDER_SITE_OTHER): Payer: BC Managed Care – PPO

## 2011-07-22 DIAGNOSIS — I4891 Unspecified atrial fibrillation: Secondary | ICD-10-CM

## 2011-07-25 ENCOUNTER — Other Ambulatory Visit (HOSPITAL_COMMUNITY): Payer: BC Managed Care – PPO | Admitting: Radiology

## 2011-07-25 ENCOUNTER — Other Ambulatory Visit: Payer: BC Managed Care – PPO | Admitting: *Deleted

## 2011-07-27 ENCOUNTER — Telehealth: Payer: Self-pay | Admitting: *Deleted

## 2011-07-27 DIAGNOSIS — I4891 Unspecified atrial fibrillation: Secondary | ICD-10-CM

## 2011-07-27 MED ORDER — METOPROLOL SUCCINATE ER 50 MG PO TB24: ORAL_TABLET | ORAL | Status: DC

## 2011-07-27 NOTE — Telephone Encounter (Signed)
Spoke with pt and gave him monitor results and Dr. Ludwig Clarks instructions to increase Toprol to 75 mg by mouth twice daily.  Pt verbalizes understanding of instructions.  Will send new [facility] [place].

## 2011-08-01 ENCOUNTER — Telehealth: Payer: Self-pay | Admitting: Cardiology

## 2011-08-01 NOTE — Telephone Encounter (Signed)
Spoke with pt, aware of up coming appt Ian Bowen

## 2011-08-01 NOTE — Telephone Encounter (Signed)
Pt called about any new appts he needs please call

## 2011-08-04 ENCOUNTER — Encounter: Payer: Self-pay | Admitting: Cardiology

## 2011-08-04 NOTE — Progress Notes (Signed)
HPI: Pleasant male for fu of CHF and atrial fibrilllation. Patient underwent mitral valve replacement with a [employer] bileaflet valve in September of 2002. Note preoperative cardiac catheterization showed normal coronary arteries and normal LV function. He apparently had subsequent repair in 2006 due to paravalvular leak. Transesophageal echocardiogram in May 2009 showed normally functioning mechanical mitral valve with no leak. LV function was normal. Patient also has a history of paroxysmal atrial fibrillation. He was seen at [facility] in September of 2012 with an episode of atrial fibrillation. TSH was normal. LFTs were mildly elevated. When he was seen at [facility] his heart rate was 150. The patient was felt to also have congestive heart failure. Cardizem was added to his medical regimen as was Lasix and potassium. A Myoview was performed in September of 2012 and showed inferior apical infarct with minimal reversibility. Ejection fraction 18%. Echocardiogram in September of 2012 showed an ejection fraction of 20%, diffuse hypokinesis, normally functioning prosthetic mitral valve, biatrial enlargement, right ventricular enlargement with decreased function. We felt his cardiomyopathy may be tachycardia mediated vs ETOH. Holter in Oct 2012 showed his rate mildly increased and we increased his toprol. Since I last saw him, his dyspnea is much improved. No dyspnea on exertion, orthopnea, PND, pedal edema, syncope or chest pain. No palpitations.   Current Outpatient Prescriptions  Medication Sig Dispense Refill  . aspirin 81 MG tablet Take 81 mg by mouth daily.        . digoxin (LANOXIN) 0.125 MG tablet Take 1 tablet (125 mcg total) by mouth daily.  30 tablet  6  . furosemide (LASIX) 20 MG tablet Take 1 tablet (20 mg total) by mouth 2 (two) times daily.  60 tablet  12  . lisinopril (ZESTRIL) 2.5 MG tablet Take 1 tablet (2.5 mg total) by mouth daily.      Marland Kitchen lovastatin (MEVACOR) 20 MG tablet Take 20  mg by mouth at bedtime.        . metoprolol (TOPROL-XL) 50 MG 24 hr tablet Take one and one half tablets by mouth twice daily  90 tablet  6  . omeprazole (PRILOSEC) 10 MG capsule Take 10 mg by mouth daily.        . tadalafil (CIALIS) 5 MG tablet Take 5 mg by mouth daily as needed.       . warfarin (COUMADIN) 7.5 MG tablet Take 7.5 mg by mouth. As directed          Past Medical History  Diagnosis Date  . Dyslipidemia   . S/P MVR (mitral valve replacement) [facility] in [place]  . Malfunction of mitral prosthetic valve     Paravalvular leak repaired in [place]  . HTN (hypertension)   . PAF (paroxysmal atrial fibrillation)   . Mitral valve prolapse     s/p replacement  . GERD (gastroesophageal reflux disease)     Past Surgical History  Procedure Date  . Mitral valve replacement 2002    with MVR repair 2006 for leaking valve  . Cardioversion 2006  . Right knee surgery   . Left arthroscopic knee surgery   . Amputation finger / thumb 1969    left ring finger  . 2d echo 07/12/2011    Normal LV size with severe global hypokinesis, EF 20%., mild pHTN, mechanical mitral valve functioning normally    History   Social History  . Marital Status: Single    Spouse Name: N/A    Number of Children: 2  .  Years of Education: N/A   Occupational History  .  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 Smoking status: Never Smoker   . Smokeless tobacco: Never Used  . Alcohol Use: No     previously wine/beer (bottle wine nightly).  stopped with results of echo  . Drug Use: No  . Sexually Active: Not on file   Other Topics Concern  . 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    ROS: no fevers or chills, productive cough, hemoptysis, dysphasia, odynophagia, melena, hematochezia, dysuria, hematuria,  rash, seizure activity, orthopnea, PND, pedal edema, claudication. Remaining systems are negative.  Physical Exam: Well-developed well-nourished in no acute distress.  Skin is warm and dry.  HEENT is normal.  Neck is supple. No thyromegaly.  Chest is clear to auscultation with normal expansion.  Cardiovascular exam is bradycardic and irregular. Abdominal exam nontender or distended. No masses palpated. Extremities show no edema. neuro grossly intact  ECG atrial fibrillation at a rate of 51. Nonspecific T-wave changes.

## 2011-08-05 ENCOUNTER — Encounter: Payer: Self-pay | Admitting: Cardiology

## 2011-08-05 ENCOUNTER — Ambulatory Visit (INDEPENDENT_AMBULATORY_CARE_PROVIDER_SITE_OTHER): Payer: BC Managed Care – PPO | Admitting: Cardiology

## 2011-08-05 DIAGNOSIS — I5031 Acute diastolic (congestive) heart failure: Secondary | ICD-10-CM

## 2011-08-05 DIAGNOSIS — E785 Hyperlipidemia, unspecified: Secondary | ICD-10-CM

## 2011-08-05 DIAGNOSIS — I059 Rheumatic mitral valve disease, unspecified: Secondary | ICD-10-CM

## 2011-08-05 DIAGNOSIS — R1013 Epigastric pain: Secondary | ICD-10-CM

## 2011-08-05 DIAGNOSIS — I10 Essential (primary) hypertension: Secondary | ICD-10-CM

## 2011-08-05 DIAGNOSIS — I509 Heart failure, unspecified: Secondary | ICD-10-CM

## 2011-08-05 DIAGNOSIS — Z952 Presence of prosthetic heart valve: Secondary | ICD-10-CM

## 2011-08-05 DIAGNOSIS — I4891 Unspecified atrial fibrillation: Secondary | ICD-10-CM

## 2011-08-05 NOTE — Assessment & Plan Note (Signed)
We'll continue SBE prophylaxis.

## 2011-08-05 NOTE — Assessment & Plan Note (Signed)
Blood pressure controlled. Continue present medications. 

## 2011-08-05 NOTE — Assessment & Plan Note (Signed)
Patient euvolemic on examination. Change Lasix to 20 mg daily with an additional 20 mg daily as needed.

## 2011-08-05 NOTE — Assessment & Plan Note (Signed)
Continue statin. 

## 2011-08-05 NOTE — Assessment & Plan Note (Signed)
Patient remains in atrial fibrillation. Given that he will require Coumadin long-term I have elected to treat with rate control and coagulation. His rate is mildly reduced. Continue Toprol but discontinue digoxin.

## 2011-08-05 NOTE — Assessment & Plan Note (Signed)
I think the patient's cardiomyopathy is most likely tachycardia mediated. His rate is now well controlled. Continue ACE inhibitor and beta blocker. Repeat echocardiogram in 3 months. Hopefully his LV function will have improved. If ejection fraction less than or equal to 35% he may need ICD.

## 2011-08-05 NOTE — Patient Instructions (Signed)
Your physician wants you to follow-up in: 3 MONTHS You will receive a reminder letter in the mail two months in advance. If you don't receive a letter, please call our office to schedule the follow-up appointment.   STOP DIGOXIN  DECREASE FUROSEMIDE 20 MG ONCE DAILY AND AS NEEDED FOR SOB AND SWELLING  Your physician has requested that you have an echocardiogram. Echocardiography is a painless test that uses sound waves to create images of your heart. It provides your doctor with information about the size and shape of your heart and how well your heart's chambers and valves are working. This procedure takes approximately one hour. There are no restrictions for this procedure.PRIOR TO FOLLOW UP APPT

## 2011-08-08 ENCOUNTER — Other Ambulatory Visit: Payer: Self-pay | Admitting: *Deleted

## 2011-08-08 MED ORDER — WARFARIN SODIUM 5 MG PO TABS: 5.0000 mg | ORAL_TABLET | Freq: Every day | ORAL | Status: DC

## 2011-08-09 ENCOUNTER — Ambulatory Visit (INDEPENDENT_AMBULATORY_CARE_PROVIDER_SITE_OTHER): Payer: BC Managed Care – PPO | Admitting: *Deleted

## 2011-08-09 DIAGNOSIS — Z7901 Long term (current) use of anticoagulants: Secondary | ICD-10-CM

## 2011-08-09 DIAGNOSIS — Z952 Presence of prosthetic heart valve: Secondary | ICD-10-CM

## 2011-08-09 DIAGNOSIS — I4891 Unspecified atrial fibrillation: Secondary | ICD-10-CM

## 2011-08-17 NOTE — Progress Notes (Signed)
Addended by: Kem Parkinson on: 08/17/2011 10:55 AM   Modules accepted: Orders

## 2011-08-21 ENCOUNTER — Encounter: Payer: Self-pay | Admitting: Family Medicine

## 2011-08-22 ENCOUNTER — Encounter: Payer: Self-pay | Admitting: Family Medicine

## 2011-08-31 ENCOUNTER — Encounter: Payer: Self-pay | Admitting: Cardiology

## 2011-09-01 ENCOUNTER — Encounter: Payer: Self-pay | Admitting: Cardiology

## 2011-09-02 ENCOUNTER — Other Ambulatory Visit: Payer: Self-pay | Admitting: *Deleted

## 2011-09-05 ENCOUNTER — Ambulatory Visit (INDEPENDENT_AMBULATORY_CARE_PROVIDER_SITE_OTHER): Payer: BC Managed Care – PPO | Admitting: *Deleted

## 2011-09-05 DIAGNOSIS — I4891 Unspecified atrial fibrillation: Secondary | ICD-10-CM

## 2011-09-05 DIAGNOSIS — Z7901 Long term (current) use of anticoagulants: Secondary | ICD-10-CM

## 2011-09-05 DIAGNOSIS — Z952 Presence of prosthetic heart valve: Secondary | ICD-10-CM

## 2011-09-05 LAB — POCT INR: INR: 3.8

## 2011-09-07 ENCOUNTER — Other Ambulatory Visit: Payer: Self-pay

## 2011-09-09 ENCOUNTER — Telehealth: Payer: Self-pay | Admitting: *Deleted

## 2011-09-09 ENCOUNTER — Other Ambulatory Visit: Payer: Self-pay | Admitting: *Deleted

## 2011-09-09 NOTE — Telephone Encounter (Signed)
[facility] for pt diltiazem er 180 mg dsily. Not listed on current med list, do not see pt has been in the hosp. Left message for pt to call to discuss Ian Bowen

## 2011-09-09 NOTE — Telephone Encounter (Signed)
Spoke with pt, he is no longer taking diltiazem Deliah Goody

## 2011-09-23 ENCOUNTER — Telehealth: Payer: Self-pay | Admitting: Internal Medicine

## 2011-09-23 ENCOUNTER — Ambulatory Visit (INDEPENDENT_AMBULATORY_CARE_PROVIDER_SITE_OTHER)
Admission: RE | Admit: 2011-09-23 | Discharge: 2011-09-23 | Disposition: A | Discharge status: Home / Self Care | Payer: BC Managed Care – PPO | Source: Ambulatory Visit | Attending: Family Medicine | Admitting: Family Medicine

## 2011-09-23 ENCOUNTER — Encounter: Payer: Self-pay | Admitting: Family Medicine

## 2011-09-23 ENCOUNTER — Ambulatory Visit (INDEPENDENT_AMBULATORY_CARE_PROVIDER_SITE_OTHER): Payer: BC Managed Care – PPO | Admitting: Family Medicine

## 2011-09-23 VITALS — BP 122/80 | HR 80 | Temp 98.2°F | Wt 186.2 lb

## 2011-09-23 DIAGNOSIS — S99911A Unspecified injury of right ankle, initial encounter: Secondary | ICD-10-CM | POA: Insufficient documentation

## 2011-09-23 DIAGNOSIS — S8990XA Unspecified injury of unspecified lower leg, initial encounter: Secondary | ICD-10-CM

## 2011-09-23 DIAGNOSIS — S99929A Unspecified injury of unspecified foot, initial encounter: Secondary | ICD-10-CM

## 2011-09-23 MED ORDER — HYDROCODONE-ACETAMINOPHEN 5-500 MG PO TABS: 1.0000 | ORAL_TABLET | ORAL | Status: DC | PRN

## 2011-09-23 NOTE — Patient Instructions (Addendum)
Placed in ASO brace during day, may take off at night. Use tylenol (acetaminophen) 500mg  three times daily, use vicodin 1 pill three times daily as needed for breakthrough pain. Ice ankle and elevate leg as much as you can. Continue crutches for 1 wk. Return 12/20 for recheck, sooner if not improving as expected. Ankle stretching/strengthening exercises provided today.

## 2011-09-23 NOTE — Telephone Encounter (Signed)
Patient wanted to know if you would like for him to come in early to get xray on his ankle before his appointment at 3:15

## 2011-09-23 NOTE — Telephone Encounter (Signed)
No we will take a look when comes in.

## 2011-09-23 NOTE — Assessment & Plan Note (Addendum)
Checked xrays given some malleolar tenderness - did not see fracture. Anticipate lateral ankle sprain.   Placed in ASO brace, return in 2 wks for f/u, sooner if needed. Tylenol scheduled for pain, vicodin for breakthrough pain.  No NSAIDs 2/2 coumadin. ADDENDUM==> xray report read as possible talus and calcaneal fractures.  Called patient, advised non weight bearing over weekend, set up early next week with ortho eval for further eval/management
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
## 2011-10-03 ENCOUNTER — Ambulatory Visit (INDEPENDENT_AMBULATORY_CARE_PROVIDER_SITE_OTHER): Payer: BC Managed Care – PPO | Admitting: *Deleted

## 2011-10-03 DIAGNOSIS — I4891 Unspecified atrial fibrillation: Secondary | ICD-10-CM

## 2011-10-03 DIAGNOSIS — Z7901 Long term (current) use of anticoagulants: Secondary | ICD-10-CM

## 2011-10-03 DIAGNOSIS — Z952 Presence of prosthetic heart valve: Secondary | ICD-10-CM

## 2011-10-03 LAB — POCT INR: INR: 5.5

## 2011-10-03 NOTE — Patient Instructions (Signed)
Encouraged leafy green salad today, patient has already has his coumadin today so unable to make changes until 12/18 and 12/19.

## 2011-10-06 ENCOUNTER — Ambulatory Visit (INDEPENDENT_AMBULATORY_CARE_PROVIDER_SITE_OTHER): Payer: BC Managed Care – PPO | Admitting: Family Medicine

## 2011-10-06 ENCOUNTER — Encounter: Payer: Self-pay | Admitting: Family Medicine

## 2011-10-06 VITALS — BP 136/72 | HR 56 | Temp 97.8°F | Wt 184.5 lb

## 2011-10-06 DIAGNOSIS — S99911A Unspecified injury of right ankle, initial encounter: Secondary | ICD-10-CM

## 2011-10-06 DIAGNOSIS — S8990XA Unspecified injury of unspecified lower leg, initial encounter: Secondary | ICD-10-CM

## 2011-10-06 NOTE — Patient Instructions (Signed)
Ankle's looking ok today. Do alphabet range of motion - upper case then lower case daily., Continue stretching exercises provided 2 wks ago. ASO brace if increased activity planned. Elevate leg as much as able.  Let us know if not improving.
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
## 2011-10-06 NOTE — Assessment & Plan Note (Signed)
Anticipate continued ankle sprain.  Healing well. Possible bony contusion as well. Anticipate continued healing. Update [place] if not improving as expected. Discussed alphabet exercises with great toe for ROM. Request records from ortho.

## 2011-10-18 DIAGNOSIS — G4733 Obstructive sleep apnea (adult) (pediatric): Secondary | ICD-10-CM

## 2011-10-18 HISTORY — DX: Obstructive sleep apnea (adult) (pediatric): G47.33

## 2011-10-24 ENCOUNTER — Ambulatory Visit (INDEPENDENT_AMBULATORY_CARE_PROVIDER_SITE_OTHER): Payer: BC Managed Care – PPO | Admitting: *Deleted

## 2011-10-24 DIAGNOSIS — Z7901 Long term (current) use of anticoagulants: Secondary | ICD-10-CM

## 2011-10-24 DIAGNOSIS — I4891 Unspecified atrial fibrillation: Secondary | ICD-10-CM

## 2011-10-24 DIAGNOSIS — Z952 Presence of prosthetic heart valve: Secondary | ICD-10-CM

## 2011-10-24 LAB — POCT INR: INR: 4.2

## 2011-11-09 ENCOUNTER — Other Ambulatory Visit (HOSPITAL_COMMUNITY): Payer: Self-pay | Admitting: Cardiology

## 2011-11-09 DIAGNOSIS — I059 Rheumatic mitral valve disease, unspecified: Secondary | ICD-10-CM

## 2011-11-09 DIAGNOSIS — I519 Heart disease, unspecified: Secondary | ICD-10-CM

## 2011-11-10 ENCOUNTER — Ambulatory Visit (INDEPENDENT_AMBULATORY_CARE_PROVIDER_SITE_OTHER): Payer: BC Managed Care – PPO | Admitting: Cardiology

## 2011-11-10 ENCOUNTER — Telehealth: Payer: Self-pay | Admitting: *Deleted

## 2011-11-10 ENCOUNTER — Ambulatory Visit (HOSPITAL_COMMUNITY): Payer: BC Managed Care – PPO | Attending: Cardiology | Admitting: Radiology

## 2011-11-10 ENCOUNTER — Ambulatory Visit (INDEPENDENT_AMBULATORY_CARE_PROVIDER_SITE_OTHER): Payer: BC Managed Care – PPO | Admitting: *Deleted

## 2011-11-10 ENCOUNTER — Encounter: Payer: Self-pay | Admitting: Cardiology

## 2011-11-10 VITALS — BP 107/52 | HR 58 | Ht 70.0 in | Wt 186.0 lb

## 2011-11-10 DIAGNOSIS — E785 Hyperlipidemia, unspecified: Secondary | ICD-10-CM

## 2011-11-10 DIAGNOSIS — I4891 Unspecified atrial fibrillation: Secondary | ICD-10-CM | POA: Insufficient documentation

## 2011-11-10 DIAGNOSIS — I10 Essential (primary) hypertension: Secondary | ICD-10-CM

## 2011-11-10 DIAGNOSIS — I059 Rheumatic mitral valve disease, unspecified: Secondary | ICD-10-CM

## 2011-11-10 DIAGNOSIS — I5030 Unspecified diastolic (congestive) heart failure: Secondary | ICD-10-CM | POA: Insufficient documentation

## 2011-11-10 DIAGNOSIS — Z954 Presence of other heart-valve replacement: Secondary | ICD-10-CM | POA: Insufficient documentation

## 2011-11-10 DIAGNOSIS — Z7901 Long term (current) use of anticoagulants: Secondary | ICD-10-CM

## 2011-11-10 DIAGNOSIS — I509 Heart failure, unspecified: Secondary | ICD-10-CM

## 2011-11-10 DIAGNOSIS — Z952 Presence of prosthetic heart valve: Secondary | ICD-10-CM

## 2011-11-10 DIAGNOSIS — I519 Heart disease, unspecified: Secondary | ICD-10-CM

## 2011-11-10 DIAGNOSIS — I428 Other cardiomyopathies: Secondary | ICD-10-CM

## 2011-11-10 NOTE — Patient Instructions (Signed)
Your physician wants you to follow-up in: 6 MONTHS You will receive a reminder letter in the mail two months in advance. If you don't receive a letter, please call our office to schedule the follow-up appointment. 

## 2011-11-10 NOTE — Assessment & Plan Note (Signed)
Blood pressure controlled. Continue present medications. 

## 2011-11-10 NOTE — Assessment & Plan Note (Signed)
Euvolemic on examination. Continue present dose of Lasix. Check potassium and renal function. 

## 2011-11-10 NOTE — Assessment & Plan Note (Signed)
Continue ACE inhibitor and beta blocker. I think his previous cardiomyopathy was most likely tachycardia mediated. Echocardiogram was repeated today. Hopefully LV function will have improved. If not we will need to consider ICD.

## 2011-11-10 NOTE — Progress Notes (Signed)
[id number] male for fu of CHF and atrial fibrilllation. Patient underwent mitral valve replacement with a [employer] bileaflet valve in September of 2002. Note preoperative cardiac catheterization showed normal coronary arteries and normal LV function. He apparently had subsequent repair in 2006 due to paravalvular leak. Transesophageal echocardiogram in May 2009 showed normally functioning mechanical mitral valve with no leak. LV function was normal. Patient also has a history of paroxysmal atrial fibrillation. He was seen at [facility] in September of 2012 with an episode of atrial fibrillation. TSH was normal. LFTs were mildly elevated. When he was seen at [facility] his heart rate was 150. The patient was felt to also have congestive heart failure. Cardizem was added to his medical regimen as was Lasix and potassium. A Myoview was performed in September of 2012 and showed inferior apical infarct with minimal reversibility. Ejection fraction 18%. Echocardiogram in September of 2012 showed an ejection fraction of 20%, diffuse hypokinesis, normally functioning prosthetic mitral valve, biatrial enlargement, right ventricular enlargement with decreased function. We felt his cardiomyopathy may be tachycardia mediated vs ETOH. Holter in Oct 2012 showed his rate mildly increased and we increased his toprol. Since I last saw him in Oct of 2012, the patient denies any dyspnea on exertion, orthopnea, PND, pedal edema, palpitations, syncope or chest pain.   Current Outpatient Prescriptions  Medication Sig Dispense Refill  . aspirin 81 MG tablet Take 81 mg by mouth daily.        . furosemide (LASIX) 20 MG tablet Take 1 tablet (20 mg total) by mouth daily.      Marland Kitchen HYDROcodone-acetaminophen (VICODIN) 5-500 MG per tablet Take 1 tablet by mouth every 4 (four) hours as needed for pain.  30 tablet  0  . lisinopril (ZESTRIL) 2.5 MG tablet Take 1 tablet (2.5 mg total) by mouth daily.      Marland Kitchen lovastatin (MEVACOR)  20 MG tablet Take 20 mg by mouth at bedtime.        . metoprolol (TOPROL-XL) 50 MG 24 hr tablet Take one and one half tablets by mouth twice daily  90 tablet  6  . omeprazole (PRILOSEC) 10 MG capsule Take 10 mg by mouth daily.        . tadalafil (CIALIS) 5 MG tablet Take 5 mg by mouth daily as needed.       . warfarin (COUMADIN) 5 MG tablet Take 1 tablet (5 mg total) by mouth daily. Take up to 1.5 tablets daily or As Directed by Coumadin Clinic  45 tablet  3     Past Medical History  Diagnosis Date  . Dyslipidemia   . S/P MVR (mitral valve replacement) [facility] in [place]  . Malfunction of mitral prosthetic valve     Paravalvular leak repaired in [place]  . HTN (hypertension)   . PAF (paroxysmal atrial fibrillation)   . Mitral valve prolapse     s/p replacement  . GERD (gastroesophageal reflux disease)   . ED (erectile dysfunction)   . Cardiomyopathy     Past Surgical History  Procedure Date  . Mitral valve replacement 2002    with MVR repair 2006 for leaking valve  . Cardioversion 2006  . Right knee surgery   . Left arthroscopic knee surgery   . Amputation finger / thumb 1969    left ring finger  . 2d echo 07/12/2011    Normal LV size with severe global hypokinesis, EF 20%., mild pHTN, mechanical mitral valve functioning normally  .  Retinal surx     OD  . Colonoscopy 2011    per pt report normal    History   Social History  . Marital Status: Single    Spouse Name: N/A    Number of Children: 2  . Years of Education: N/A   Occupational History  .  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 Smoking status: Never Smoker   . Smokeless tobacco: Never Used  . Alcohol Use: No     previously wine/beer (bottle wine nightly).  stopped with results of echo  . Drug Use: No  . Sexually Active: Not on file   Other Topics Concern  . 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    ROS: no fevers or chills, productive cough, hemoptysis, dysphasia, odynophagia, melena, hematochezia, dysuria, hematuria, rash, seizure activity, orthopnea, PND, pedal edema, claudication. Remaining systems are negative.  Physical Exam: Well-developed well-nourished in no acute distress.  Skin is warm and dry.  HEENT is normal.  Neck is supple. No thyromegaly.  Chest is clear to auscultation with normal expansion.  Cardiovascular exam is irregular, crisp mechanical valve sound Abdominal exam nontender or distended. No masses palpated. Extremities show no edema. neuro grossly intact  ECG atrial fibrillation at a rate of 58. No ST changes.

## 2011-11-10 NOTE — Assessment & Plan Note (Signed)
Continue SBE prophylaxis. Repeat echocardiogram performed today.

## 2011-11-10 NOTE — Telephone Encounter (Signed)
Message copied by Freddi Starr on Thu Nov 10, 2011  3:23 PM ------      Message from: Lewayne Bunting      Created: Thu Nov 10, 2011 11:39 AM       I reviewed and EF appears to be 25-30; may be improving; repeat echo in 3 months      Olga Millers

## 2011-11-10 NOTE — Assessment & Plan Note (Signed)
Management per primary care. 

## 2011-11-10 NOTE — Assessment & Plan Note (Signed)
Continue present dose of Toprol. Continue Coumadin. He has been in atrial fibrillation for at least one year. He is also asymptomatic. He will require Coumadin for his mitral valve replacement. We will therefore not pursue cardioversion.

## 2011-11-10 NOTE — Assessment & Plan Note (Signed)
Followed in the Coumadin clinic. 

## 2011-11-11 LAB — BASIC METABOLIC PANEL
BUN: 15 mg/dL (ref 6–23)
Chloride: 106 mEq/L (ref 96–112)
GFR: 79.2 mL/min (ref >60.00)
Potassium: 4.1 mEq/L (ref 3.5–5.1)
Sodium: 142 mEq/L (ref 135–145)

## 2011-11-28 ENCOUNTER — Ambulatory Visit (INDEPENDENT_AMBULATORY_CARE_PROVIDER_SITE_OTHER): Payer: BC Managed Care – PPO | Admitting: Pharmacist

## 2011-11-28 DIAGNOSIS — Z954 Presence of other heart-valve replacement: Secondary | ICD-10-CM

## 2011-11-28 DIAGNOSIS — I4891 Unspecified atrial fibrillation: Secondary | ICD-10-CM

## 2011-11-28 DIAGNOSIS — Z7901 Long term (current) use of anticoagulants: Secondary | ICD-10-CM

## 2011-11-28 DIAGNOSIS — Z952 Presence of prosthetic heart valve: Secondary | ICD-10-CM

## 2011-11-28 LAB — POCT INR: INR: 4.3

## 2011-12-20 ENCOUNTER — Ambulatory Visit (INDEPENDENT_AMBULATORY_CARE_PROVIDER_SITE_OTHER): Payer: BC Managed Care – PPO | Admitting: *Deleted

## 2011-12-20 DIAGNOSIS — I4891 Unspecified atrial fibrillation: Secondary | ICD-10-CM

## 2011-12-20 DIAGNOSIS — Z952 Presence of prosthetic heart valve: Secondary | ICD-10-CM

## 2011-12-20 DIAGNOSIS — Z954 Presence of other heart-valve replacement: Secondary | ICD-10-CM

## 2011-12-20 DIAGNOSIS — Z7901 Long term (current) use of anticoagulants: Secondary | ICD-10-CM
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
## 2012-01-07 ENCOUNTER — Other Ambulatory Visit: Payer: Self-pay | Admitting: Cardiology

## 2012-01-18 ENCOUNTER — Ambulatory Visit (INDEPENDENT_AMBULATORY_CARE_PROVIDER_SITE_OTHER): Payer: BC Managed Care – PPO | Admitting: *Deleted

## 2012-01-18 DIAGNOSIS — I4891 Unspecified atrial fibrillation: Secondary | ICD-10-CM

## 2012-01-18 DIAGNOSIS — Z7901 Long term (current) use of anticoagulants: Secondary | ICD-10-CM

## 2012-01-18 DIAGNOSIS — Z952 Presence of prosthetic heart valve: Secondary | ICD-10-CM

## 2012-01-18 DIAGNOSIS — Z954 Presence of other heart-valve replacement: Secondary | ICD-10-CM

## 2012-01-18 LAB — POCT INR: INR: 3.2

## 2012-02-13 ENCOUNTER — Other Ambulatory Visit: Payer: Self-pay

## 2012-02-13 ENCOUNTER — Ambulatory Visit (INDEPENDENT_AMBULATORY_CARE_PROVIDER_SITE_OTHER): Payer: BC Managed Care – PPO | Admitting: *Deleted

## 2012-02-13 ENCOUNTER — Ambulatory Visit (HOSPITAL_COMMUNITY): Payer: BC Managed Care – PPO | Attending: Cardiology

## 2012-02-13 DIAGNOSIS — Z954 Presence of other heart-valve replacement: Secondary | ICD-10-CM

## 2012-02-13 DIAGNOSIS — Z7901 Long term (current) use of anticoagulants: Secondary | ICD-10-CM

## 2012-02-13 DIAGNOSIS — E785 Hyperlipidemia, unspecified: Secondary | ICD-10-CM | POA: Insufficient documentation

## 2012-02-13 DIAGNOSIS — I4891 Unspecified atrial fibrillation: Secondary | ICD-10-CM | POA: Insufficient documentation

## 2012-02-13 DIAGNOSIS — I059 Rheumatic mitral valve disease, unspecified: Secondary | ICD-10-CM | POA: Insufficient documentation

## 2012-02-13 DIAGNOSIS — Z952 Presence of prosthetic heart valve: Secondary | ICD-10-CM

## 2012-02-13 DIAGNOSIS — I517 Cardiomegaly: Secondary | ICD-10-CM | POA: Insufficient documentation

## 2012-02-13 DIAGNOSIS — I509 Heart failure, unspecified: Secondary | ICD-10-CM | POA: Insufficient documentation

## 2012-02-13 DIAGNOSIS — I519 Heart disease, unspecified: Secondary | ICD-10-CM | POA: Insufficient documentation

## 2012-02-13 DIAGNOSIS — I10 Essential (primary) hypertension: Secondary | ICD-10-CM | POA: Insufficient documentation

## 2012-02-23 ENCOUNTER — Encounter: Payer: Self-pay | Admitting: Cardiology

## 2012-02-23 ENCOUNTER — Other Ambulatory Visit: Payer: Self-pay | Admitting: Cardiology

## 2012-02-23 DIAGNOSIS — I509 Heart failure, unspecified: Secondary | ICD-10-CM

## 2012-02-23 DIAGNOSIS — Z952 Presence of prosthetic heart valve: Secondary | ICD-10-CM

## 2012-02-23 DIAGNOSIS — I5031 Acute diastolic (congestive) heart failure: Secondary | ICD-10-CM

## 2012-02-23 DIAGNOSIS — R1013 Epigastric pain: Secondary | ICD-10-CM

## 2012-02-23 DIAGNOSIS — I10 Essential (primary) hypertension: Secondary | ICD-10-CM

## 2012-02-23 DIAGNOSIS — I059 Rheumatic mitral valve disease, unspecified: Secondary | ICD-10-CM

## 2012-02-23 DIAGNOSIS — I4891 Unspecified atrial fibrillation: Secondary | ICD-10-CM

## 2012-02-23 DIAGNOSIS — E785 Hyperlipidemia, unspecified: Secondary | ICD-10-CM

## 2012-02-23 MED ORDER — WARFARIN SODIUM 5 MG PO TABS: ORAL_TABLET | ORAL | Status: DC

## 2012-02-23 MED ORDER — LOVASTATIN 20 MG PO TABS: 20.0000 mg | ORAL_TABLET | Freq: Every day | ORAL | Status: DC

## 2012-02-23 MED ORDER — METOPROLOL SUCCINATE ER 50 MG PO TB24: ORAL_TABLET | ORAL | Status: DC

## 2012-02-23 MED ORDER — LISINOPRIL 2.5 MG PO TABS: 2.5000 mg | ORAL_TABLET | Freq: Every day | ORAL | Status: DC

## 2012-02-23 MED ORDER — TADALAFIL 5 MG PO TABS: 5.0000 mg | ORAL_TABLET | Freq: Every day | ORAL | Status: DC | PRN

## 2012-03-06 ENCOUNTER — Ambulatory Visit (INDEPENDENT_AMBULATORY_CARE_PROVIDER_SITE_OTHER): Payer: BC Managed Care – PPO | Admitting: Pharmacist

## 2012-03-06 DIAGNOSIS — I4891 Unspecified atrial fibrillation: Secondary | ICD-10-CM

## 2012-03-06 DIAGNOSIS — Z7901 Long term (current) use of anticoagulants: Secondary | ICD-10-CM

## 2012-03-06 DIAGNOSIS — Z952 Presence of prosthetic heart valve: Secondary | ICD-10-CM

## 2012-03-06 DIAGNOSIS — Z954 Presence of other heart-valve replacement: Secondary | ICD-10-CM

## 2012-04-02 ENCOUNTER — Ambulatory Visit (INDEPENDENT_AMBULATORY_CARE_PROVIDER_SITE_OTHER): Payer: BC Managed Care – PPO | Admitting: *Deleted

## 2012-04-02 DIAGNOSIS — Z954 Presence of other heart-valve replacement: Secondary | ICD-10-CM

## 2012-04-02 DIAGNOSIS — Z7901 Long term (current) use of anticoagulants: Secondary | ICD-10-CM

## 2012-04-02 DIAGNOSIS — I4891 Unspecified atrial fibrillation: Secondary | ICD-10-CM

## 2012-04-02 DIAGNOSIS — Z952 Presence of prosthetic heart valve: Secondary | ICD-10-CM

## 2012-04-02 LAB — POCT INR: INR: 2.6

## 2012-04-26 ENCOUNTER — Encounter: Payer: Self-pay | Admitting: Cardiology

## 2012-04-26 ENCOUNTER — Ambulatory Visit (INDEPENDENT_AMBULATORY_CARE_PROVIDER_SITE_OTHER): Payer: BC Managed Care – PPO | Admitting: Cardiology

## 2012-04-26 VITALS — BP 123/70 | HR 57 | Ht 70.0 in | Wt 191.0 lb

## 2012-04-26 DIAGNOSIS — E785 Hyperlipidemia, unspecified: Secondary | ICD-10-CM

## 2012-04-26 DIAGNOSIS — I4891 Unspecified atrial fibrillation: Secondary | ICD-10-CM

## 2012-04-26 DIAGNOSIS — I428 Other cardiomyopathies: Secondary | ICD-10-CM

## 2012-04-26 DIAGNOSIS — I509 Heart failure, unspecified: Secondary | ICD-10-CM

## 2012-04-26 DIAGNOSIS — I10 Essential (primary) hypertension: Secondary | ICD-10-CM

## 2012-04-26 DIAGNOSIS — I429 Cardiomyopathy, unspecified: Secondary | ICD-10-CM

## 2012-04-26 LAB — CBC WITH DIFFERENTIAL/PLATELET
Basophils Relative: 0.7 % (ref 0.0–3.0)
Eosinophils Absolute: 0.3 10*3/uL (ref 0.0–0.7)
HCT: 43.3 % (ref 39.0–52.0)
Lymphs Abs: 1.4 10*3/uL (ref 0.7–4.0)
MCHC: 32.8 g/dL (ref 30.0–36.0)
MCV: 97.6 fl (ref 78.0–100.0)
Monocytes Absolute: 0.5 10*3/uL (ref 0.1–1.0)
Neutrophils Relative %: 62.3 % (ref 43.0–77.0)
Platelets: 239 10*3/uL (ref 150.0–400.0)
RBC: 4.43 Mil/uL (ref 4.22–5.81)

## 2012-04-26 LAB — BASIC METABOLIC PANEL
BUN: 18 mg/dL (ref 6–23)
CO2: 25 mEq/L (ref 19–32)
Chloride: 104 mEq/L (ref 96–112)
Creatinine, Ser: 1.1 mg/dL (ref 0.4–1.5)
Potassium: 3.8 mEq/L (ref 3.5–5.1)

## 2012-04-26 NOTE — Progress Notes (Signed)
HPI: Pleasant male for fu of cardiomyopathy and atrial fibrilllation. Patient underwent mitral valve replacement with a [employer] bileaflet valve in September of 2002. Note preoperative cardiac catheterization showed normal coronary arteries and normal LV function. He apparently had subsequent repair in 2006 due to paravalvular leak. Transesophageal echocardiogram in May 2009 showed normally functioning mechanical mitral valve with no leak. LV function was normal. Patient also has a history of paroxysmal atrial fibrillation. He was seen at [facility] in September of 2012 with an episode of atrial fibrillation. TSH was normal. LFTs were mildly elevated. When he was seen at [facility] his heart rate was 150. The patient was felt to also have congestive heart failure. Cardizem was added to his medical regimen as was Lasix and potassium. A Myoview was performed in September of 2012 and showed inferior apical infarct with minimal reversibility. Ejection fraction 18%. Echocardiogram in September of 2012 showed an ejection fraction of 20%, diffuse hypokinesis, normally functioning prosthetic mitral valve, biatrial enlargement, right ventricular enlargement with decreased function. We felt his cardiomyopathy may be tachycardia mediated vs ETOH. Holter in Oct 2012 showed his rate mildly increased and we increased his toprol. Echo repeated in April 2013 and revealed EF 45-50, MVR with mean gradient of 7 mmHg, trace AI, biatrial enlargement and mild RVE; small mobile density associated with MVR; ? Residual MV apparatus; since I last saw him, the patient denies any dyspnea on exertion, orthopnea, PND, pedal edema, palpitations, syncope or chest pain.    Current Outpatient Prescriptions  Medication Sig Dispense Refill  . aspirin 81 MG tablet Take 81 mg by mouth daily.        . furosemide (LASIX) 20 MG tablet Take 1 tablet (20 mg total) by mouth daily.      Marland Kitchen HYDROcodone-acetaminophen (VICODIN) 5-500 MG per  tablet Take 1 tablet by mouth every 4 (four) hours as needed for pain.  30 tablet  0  . lisinopril (PRINIVIL,ZESTRIL) 5 MG tablet Take 5 mg by mouth daily.      Marland Kitchen lovastatin (MEVACOR) 20 MG tablet Take 1 tablet (20 mg total) by mouth at bedtime.  90 tablet  3  . metoprolol succinate (TOPROL-XL) 50 MG 24 hr tablet Take one and one half tablets by mouth twice daily  270 tablet  3  . omeprazole (PRILOSEC) 10 MG capsule Take 10 mg by mouth daily.        . tadalafil (CIALIS) 5 MG tablet Take 1 tablet (5 mg total) by mouth daily as needed.  90 tablet  3  . warfarin (COUMADIN) 5 MG tablet Take as directed by Coumadin Clinic  120 tablet  1     Past Medical History  Diagnosis Date  . Dyslipidemia   . S/P MVR (mitral valve replacement) [facility] in [place]  . Malfunction of mitral prosthetic valve     Paravalvular leak repaired in [place]  . HTN (hypertension)   . PAF (paroxysmal atrial fibrillation)   . Mitral valve prolapse     s/p replacement  . GERD (gastroesophageal reflux disease)   . ED (erectile dysfunction)   . Cardiomyopathy     Past Surgical History  Procedure Date  . Mitral valve replacement 2002    with MVR repair 2006 for leaking valve  . Cardioversion 2006  . Right knee surgery   . Left arthroscopic knee surgery   . Amputation finger / thumb 1969    left ring finger  . 2d echo 07/12/2011  Normal LV size with severe global hypokinesis, EF 20%., mild pHTN, mechanical mitral valve functioning normally  . Retinal surx     OD  . Colonoscopy 2011    per pt report normal    History   Social History  . Marital Status: Single    Spouse Name: N/A    Number of Children: 2  . Years of Education: N/A   Occupational History  .  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 Smoking status: Never Smoker   . Smokeless tobacco: Never Used  . Alcohol Use: No     previously wine/beer (bottle wine nightly).  stopped with results of echo  . Drug Use: No  .  Sexually Active: Not on file   Other Topics Concern  . 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    ROS: no fevers or chills, productive cough, hemoptysis, dysphasia, odynophagia, melena, hematochezia, dysuria, hematuria, rash, seizure activity, orthopnea, PND, pedal edema, claudication. Remaining systems are negative.  Physical Exam: Well-developed well-nourished in no acute distress.  Skin is warm and dry.  HEENT is normal.  Neck is supple.  Chest is clear to auscultation with normal expansion.  Cardiovascular exam is irregular, crisp mechanical valve sound Abdominal exam nontender or distended. No masses palpated. Extremities show no edema. neuro grossly intact  ECG atrial fibrillation at a rate of 57. No significant ST changes.

## 2012-04-26 NOTE — Assessment & Plan Note (Signed)
Continue present blood pressure medications. 

## 2012-04-26 NOTE — Patient Instructions (Addendum)
Your physician wants you to follow-up in: ONE YEAR WITH DR CRENSHAW You will receive a reminder letter in the mail two months in advance. If you don't receive a letter, please call our office to schedule the follow-up appointment.   Your physician recommends that you HAVE LAB WORK TODAY 

## 2012-04-26 NOTE — Assessment & Plan Note (Signed)
LV function has improved. Previous reduction in ejection fraction most likely tachycardia mediated and has improved with rate control. Continue present medications. Repeat echocardiogram in one year.

## 2012-04-26 NOTE — Assessment & Plan Note (Signed)
euvolemic on examination. Continue present dose of Lasix. Check BNP. If normal we will change Lasix to as needed.

## 2012-04-26 NOTE — Assessment & Plan Note (Signed)
Continue rate control and anticoagulation. Check hemoglobin. Check potassium and renal function given diuretic use.

## 2012-04-26 NOTE — Assessment & Plan Note (Signed)
Continue SBE prophylaxis. Repeat echocardiogram and he returns in one year.

## 2012-04-26 NOTE — Assessment & Plan Note (Signed)
Management per primary care. 

## 2012-05-14 ENCOUNTER — Ambulatory Visit (INDEPENDENT_AMBULATORY_CARE_PROVIDER_SITE_OTHER): Payer: BC Managed Care – PPO | Admitting: *Deleted

## 2012-05-14 DIAGNOSIS — Z7901 Long term (current) use of anticoagulants: Secondary | ICD-10-CM

## 2012-05-14 DIAGNOSIS — Z954 Presence of other heart-valve replacement: Secondary | ICD-10-CM

## 2012-05-14 DIAGNOSIS — I4891 Unspecified atrial fibrillation: Secondary | ICD-10-CM

## 2012-05-14 DIAGNOSIS — Z952 Presence of prosthetic heart valve: Secondary | ICD-10-CM

## 2012-05-14 LAB — POCT INR: INR: 1.9

## 2012-06-11 ENCOUNTER — Ambulatory Visit (INDEPENDENT_AMBULATORY_CARE_PROVIDER_SITE_OTHER): Payer: BC Managed Care – PPO | Admitting: *Deleted

## 2012-06-11 DIAGNOSIS — Z7901 Long term (current) use of anticoagulants: Secondary | ICD-10-CM

## 2012-06-11 DIAGNOSIS — I4891 Unspecified atrial fibrillation: Secondary | ICD-10-CM

## 2012-06-11 DIAGNOSIS — Z954 Presence of other heart-valve replacement: Secondary | ICD-10-CM

## 2012-06-11 DIAGNOSIS — Z952 Presence of prosthetic heart valve: Secondary | ICD-10-CM

## 2012-06-22 ENCOUNTER — Ambulatory Visit (INDEPENDENT_AMBULATORY_CARE_PROVIDER_SITE_OTHER): Payer: BC Managed Care – PPO | Admitting: Family Medicine

## 2012-06-22 ENCOUNTER — Encounter: Payer: Self-pay | Admitting: Family Medicine

## 2012-06-22 VITALS — BP 122/80 | HR 58 | Temp 98.1°F | Ht 70.5 in | Wt 196.5 lb

## 2012-06-22 DIAGNOSIS — G4730 Sleep apnea, unspecified: Secondary | ICD-10-CM

## 2012-06-22 DIAGNOSIS — I4891 Unspecified atrial fibrillation: Secondary | ICD-10-CM

## 2012-06-22 MED ORDER — CYCLOBENZAPRINE HCL 10 MG PO TABS: 10.0000 mg | ORAL_TABLET | Freq: Two times a day (BID) | ORAL | Status: AC | PRN

## 2012-06-22 MED ORDER — FUROSEMIDE 20 MG PO TABS: 20.0000 mg | ORAL_TABLET | Freq: Every day | ORAL | Status: DC
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
## 2012-06-22 NOTE — Patient Instructions (Signed)
Good to see you today.  felxeril sent to pharmacy Pass by Marion's office to schedule sleep study (lung doctor evaluation). Call us with questions.

## 2012-06-22 NOTE — Assessment & Plan Note (Signed)
ESS = 15 Very reasonable given hx to obtain sleep study to further eval daytime sleepiness and nocturnal apnea. Pt agreeable.  Hesitant to pursue CPAP if needed, but would consider other treatment modalities.

## 2012-07-16 ENCOUNTER — Ambulatory Visit (INDEPENDENT_AMBULATORY_CARE_PROVIDER_SITE_OTHER): Payer: BC Managed Care – PPO | Admitting: *Deleted

## 2012-07-16 DIAGNOSIS — Z954 Presence of other heart-valve replacement: Secondary | ICD-10-CM

## 2012-07-16 DIAGNOSIS — Z7901 Long term (current) use of anticoagulants: Secondary | ICD-10-CM

## 2012-07-16 DIAGNOSIS — Z952 Presence of prosthetic heart valve: Secondary | ICD-10-CM

## 2012-07-16 DIAGNOSIS — I4891 Unspecified atrial fibrillation: Secondary | ICD-10-CM

## 2012-07-16 LAB — POCT INR: INR: 3.1

## 2012-07-20 ENCOUNTER — Ambulatory Visit (INDEPENDENT_AMBULATORY_CARE_PROVIDER_SITE_OTHER): Payer: BC Managed Care – PPO | Admitting: Pulmonary Disease

## 2012-07-20 ENCOUNTER — Encounter: Payer: Self-pay | Admitting: Pulmonary Disease

## 2012-07-20 VITALS — BP 108/70 | HR 92 | Temp 98.1°F | Ht 70.0 in | Wt 193.2 lb

## 2012-07-20 DIAGNOSIS — G4710 Hypersomnia, unspecified: Secondary | ICD-10-CM

## 2012-07-20 NOTE — Assessment & Plan Note (Signed)
The patient has significant daytime hypersomnia, and it is unclear how much of this is related to a sleep disorder at night, his poor sleep hygiene, and possibly narcolepsy.  He has a history of inappropriate sleepiness dating back to his teenage years, and therefore we need to at least consider the latter diagnosis.  At this point, I think the patient needs to have a sleep study, followed by [facility] if the sleep study is not abnormal.  I have also given him 2 weeks worth of sleep diaries to fill out to get an idea of his sleep schedule.

## 2012-07-20 NOTE — Progress Notes (Signed)
  Subjective:    Patient ID: Ian Bowen, male    DOB: 07/23/1951, 61 y.o.   MRN: [id number]  HPI The patient is a 61 year old male who I've been asked to see for significant hypersomnia.  The patient states that he has had sleepiness issues dating back to his young adulthood, and would often fall asleep in class and while doing homework.  He tells me that he has been sleepy for as long as he can remember, and that his father had the same issue.  Currently, he has been noted to have snoring, as well as witnessed apneas.  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  Despite this, the patient feels rested in the mornings upon arising.  He does note sleep pressure with periods of inactivity during the day, but has no sleepiness if he remains active.  He can fall asleep while eating in a restaurant, and cannot stay awake to watch movies or television.  He can fall asleep easily in traffic at a standstill, and his Epworth score today is abnormal at 17.  The patient states that his weight is unchanged over the last 2 years.  He denies hypnagogic hallucinations, sleep paralysis, or cataplexy.  Sleep Questionnaire: What time do you typically go to bed?( Between what hours) 10 - 12 pm How long does it take you to fall asleep? no time at all How many times during the night do you wake up? 2 What time do you get out of bed to start your day? 0600 Do you drive or operate heavy machinery in your occupation? No How much has your weight changed (up or down) over the past two years? (In pounds) 5 lb (2.268 kg) Have you ever had a sleep study before? No Do you currently use CPAP? No Do you wear oxygen at any time? No    Review of Systems  Constitutional: Negative for fever and unexpected weight change.  HENT: Positive for congestion and rhinorrhea. Negative for ear pain, nosebleeds, sore throat, sneezing, trouble swallowing, dental problem, postnasal drip and  sinus pressure.   Eyes: Negative for redness and itching.  Respiratory: Negative for cough, chest tightness, shortness of breath and wheezing.   Cardiovascular: Negative for palpitations and leg swelling.  Gastrointestinal: Negative for nausea and vomiting.  Genitourinary: Negative for dysuria.  Musculoskeletal: Negative for joint swelling.  Skin: Negative for rash.  Neurological: Negative for headaches.  Hematological: Bruises/bleeds easily.  Psychiatric/Behavioral: Negative for dysphoric mood. The patient is not nervous/anxious.        Objective:   Physical Exam Constitutional:  Well developed, no acute distress  HENT:  Nares patent without discharge  Oropharynx without exudate, palate and uvula are normal  Eyes:  Perrla, eomi, no scleral icterus  Neck:  No JVD, no TMG  Cardiovascular:  Normal rate, regular rhythm, no rubs or gallops.  No murmurs        Intact distal pulses  Pulmonary :  Normal breath sounds, no stridor or respiratory distress   No rales, rhonchi, or wheezing  Abdominal:  Soft, nondistended, bowel sounds present.  No tenderness noted.   Musculoskeletal:  No lower extremity edema noted.  Lymph Nodes:  No cervical lymphadenopathy noted  Skin:  No cyanosis noted  Neurologic:  Alert, appropriate, moves all 4 extremities without obvious deficit.         Assessment & Plan:

## 2012-07-20 NOTE — Patient Instructions (Addendum)
Will schedule for sleep study, and will followup with nap study if normal Please fill out sleep logs leading up to the sleep study, and take with you to the sleep lab.

## 2012-07-24 ENCOUNTER — Telehealth: Payer: Self-pay | Admitting: *Deleted

## 2012-07-24 ENCOUNTER — Encounter: Payer: Self-pay | Admitting: Family Medicine

## 2012-07-24 ENCOUNTER — Ambulatory Visit (INDEPENDENT_AMBULATORY_CARE_PROVIDER_SITE_OTHER): Payer: BC Managed Care – PPO | Admitting: Family Medicine

## 2012-07-24 VITALS — BP 100/70 | HR 70 | Temp 97.8°F | Resp 20 | Ht 70.0 in | Wt 193.8 lb

## 2012-07-24 DIAGNOSIS — M25529 Pain in unspecified elbow: Secondary | ICD-10-CM

## 2012-07-24 DIAGNOSIS — M25522 Pain in left elbow: Secondary | ICD-10-CM

## 2012-07-24 MED ORDER — DOXYCYCLINE HYCLATE 100 MG PO CAPS: 100.0000 mg | ORAL_CAPSULE | Freq: Two times a day (BID) | ORAL | Status: DC

## 2012-07-24 NOTE — Progress Notes (Signed)
  Subjective:    Patient ID: Ian Bowen, male    DOB: [birth year], 61 y.o.   MRN: [id number]  HPI CC: left elbow swollen  Mosquito bite 1 mo ago, has remained irritated.  Has been picking at it.  Last night with worsening swelling and soreness at olecranon bursa.  Overnight more red, tender warm.  Is on coumadin. Lab Results  Component Value Date   INR 3.1 07/16/2012   INR 2.5 06/11/2012   INR 1.9 05/14/2012    No fevers, nausea/vomiting.  Has never had anything like this in past.  Past Medical History  Diagnosis Date  . Dyslipidemia   . S/P MVR (mitral valve replacement) [facility] in [place]  . Malfunction of mitral prosthetic valve     Paravalvular leak repaired in [place]  . HTN (hypertension)   . PAF (paroxysmal atrial fibrillation)   . Mitral valve prolapse     s/p replacement  . GERD (gastroesophageal reflux disease)   . ED (erectile dysfunction)   . Cardiomyopathy    Current Outpatient Prescriptions on File Prior to Visit  Medication Sig Dispense Refill  . aspirin 81 MG tablet Take 81 mg by mouth daily.        . furosemide (LASIX) 20 MG tablet Take 1 tablet (20 mg total) by mouth daily.  90 tablet  3  . lisinopril (PRINIVIL,ZESTRIL) 5 MG tablet Take 5 mg by mouth daily.      Marland Kitchen lovastatin (MEVACOR) 20 MG tablet Take 1 tablet (20 mg total) by mouth at bedtime.  90 tablet  3  . metoprolol succinate (TOPROL-XL) 50 MG 24 hr tablet Take one and one half tablets by mouth twice daily  270 tablet  3  . omeprazole (PRILOSEC) 10 MG capsule Take 10 mg by mouth daily.        Marland Kitchen warfarin (COUMADIN) 5 MG tablet Take as directed by Coumadin Clinic  120 tablet  1  . cyclobenzaprine (FLEXERIL) 10 MG tablet       . HYDROcodone-acetaminophen (VICODIN) 5-500 MG per tablet Take 1 tablet by mouth every 4 (four) hours as needed for pain.  30 tablet  0  . tadalafil (CIALIS) 5 MG tablet Take 1 tablet (5 mg total) by mouth daily as needed.  90 tablet  3    Review of  Systems Per HPI    Objective:   Physical Exam  Nursing note and vitals reviewed. Constitutional: He appears well-developed and well-nourished. No distress.  Musculoskeletal: Normal range of motion. He exhibits edema.       Swelling and erythema, warmth present at left elbow overlying skin of olecranon bursa, with small scab midline as well. No fluctuance/induration. + mild swelling of bursa present FROM of elbow but painful at end flexion       Assessment & Plan:

## 2012-07-24 NOTE — Telephone Encounter (Signed)
Started on doxycycline today, will make earlier f/u for INR check, patient is taking a trip and unable to come here on 10/11, will check INR on 07/30/12

## 2012-07-24 NOTE — Assessment & Plan Note (Signed)
Anticipate elbow cellulitis vs infected olecranon bursitis. Not significant swelling for aspiration. No known h/o MRSA. Will treat with course of doxy and recommended warm compresses. To call coumadin clinic for adjustment of coumadin dosing while on doxy and likely sooner INR check (next Tues returns from [place]). If not improved, seek urgent care at [facility].  Pt agrees with plan.

## 2012-07-24 NOTE — Telephone Encounter (Signed)
On doxy

## 2012-07-24 NOTE — Patient Instructions (Addendum)
I think you have skin infection of the tip of elbow, less likely bursa infection. Treat with warm compresses and doxycycline for 10 days. Call coumadin clinic to see if they want to change your dose while on doxycycline. If area coming to a head, or worsening pain/swelling or fever, please seek urgent care. Good to see you today, call us with questions.

## 2012-07-30 ENCOUNTER — Ambulatory Visit (INDEPENDENT_AMBULATORY_CARE_PROVIDER_SITE_OTHER): Payer: BC Managed Care – PPO | Admitting: *Deleted

## 2012-07-30 DIAGNOSIS — Z952 Presence of prosthetic heart valve: Secondary | ICD-10-CM

## 2012-07-30 DIAGNOSIS — Z954 Presence of other heart-valve replacement: Secondary | ICD-10-CM

## 2012-07-30 DIAGNOSIS — I4891 Unspecified atrial fibrillation: Secondary | ICD-10-CM

## 2012-07-30 DIAGNOSIS — Z7901 Long term (current) use of anticoagulants: Secondary | ICD-10-CM

## 2012-08-03 ENCOUNTER — Other Ambulatory Visit: Payer: Self-pay | Admitting: *Deleted

## 2012-08-03 ENCOUNTER — Other Ambulatory Visit: Payer: Self-pay | Admitting: Cardiology

## 2012-08-03 MED ORDER — WARFARIN SODIUM 5 MG PO TABS: 5.0000 mg | ORAL_TABLET | ORAL | Status: DC

## 2012-08-06 ENCOUNTER — Ambulatory Visit (INDEPENDENT_AMBULATORY_CARE_PROVIDER_SITE_OTHER): Payer: BC Managed Care – PPO

## 2012-08-06 DIAGNOSIS — Z7901 Long term (current) use of anticoagulants: Secondary | ICD-10-CM

## 2012-08-06 DIAGNOSIS — Z954 Presence of other heart-valve replacement: Secondary | ICD-10-CM

## 2012-08-06 DIAGNOSIS — I4891 Unspecified atrial fibrillation: Secondary | ICD-10-CM

## 2012-08-06 DIAGNOSIS — Z952 Presence of prosthetic heart valve: Secondary | ICD-10-CM

## 2012-08-19 ENCOUNTER — Encounter (HOSPITAL_BASED_OUTPATIENT_CLINIC_OR_DEPARTMENT_OTHER): Payer: BC Managed Care – PPO

## 2012-08-20 ENCOUNTER — Encounter (HOSPITAL_BASED_OUTPATIENT_CLINIC_OR_DEPARTMENT_OTHER): Payer: BC Managed Care – PPO

## 2012-08-28 ENCOUNTER — Ambulatory Visit (INDEPENDENT_AMBULATORY_CARE_PROVIDER_SITE_OTHER): Payer: BC Managed Care – PPO | Admitting: *Deleted

## 2012-08-28 DIAGNOSIS — Z954 Presence of other heart-valve replacement: Secondary | ICD-10-CM

## 2012-08-28 DIAGNOSIS — Z7901 Long term (current) use of anticoagulants: Secondary | ICD-10-CM

## 2012-08-28 DIAGNOSIS — I4891 Unspecified atrial fibrillation: Secondary | ICD-10-CM

## 2012-08-28 DIAGNOSIS — Z952 Presence of prosthetic heart valve: Secondary | ICD-10-CM

## 2012-09-02 ENCOUNTER — Ambulatory Visit (HOSPITAL_BASED_OUTPATIENT_CLINIC_OR_DEPARTMENT_OTHER): Payer: BC Managed Care – PPO | Attending: Pulmonary Disease | Admitting: Radiology

## 2012-09-02 VITALS — Ht 70.5 in | Wt 185.0 lb

## 2012-09-02 DIAGNOSIS — G4710 Hypersomnia, unspecified: Secondary | ICD-10-CM | POA: Insufficient documentation

## 2012-09-02 DIAGNOSIS — G4733 Obstructive sleep apnea (adult) (pediatric): Secondary | ICD-10-CM | POA: Insufficient documentation

## 2012-09-03 ENCOUNTER — Encounter (HOSPITAL_BASED_OUTPATIENT_CLINIC_OR_DEPARTMENT_OTHER): Payer: BC Managed Care – PPO

## 2012-09-11 DIAGNOSIS — G4730 Sleep apnea, unspecified: Secondary | ICD-10-CM

## 2012-09-11 DIAGNOSIS — G4710 Hypersomnia, unspecified: Secondary | ICD-10-CM

## 2012-09-11 NOTE — Procedures (Signed)
[name], Ian Bowen NO.:  [id number]  MEDICAL RECORD NO.:  [id number]          PATIENT TYPE:  OUT  LOCATION:  SLEEP CENTER                 FACILITY:  [facility]  PHYSICIAN:  Barbaraann Share, MD,FCCPDATE OF BIRTH:  03-25[birth year]1952  DATE OF STUDY:  09/02/2012                           NOCTURNAL POLYSOMNOGRAM  REFERRING PHYSICIAN:  Barbaraann Share, MD,FCCP  LOCATION:  Sleep Lab.  REFERRING PHYSICIAN:  Barbaraann Share, MD,FCCP  INDICATION FOR STUDY:  Hypersomnia with sleep apnea.  EPWORTH SLEEPINESS SCORE:  13.  SLEEP ARCHITECTURE:  The patient had a total sleep time of 295 minutes with no slow-wave sleep and only 64 minutes of REM.  Sleep onset latency was normal at 11 minutes, and REM onset was normal at 82 minutes.  Sleep efficiency was moderately decreased at 74%.  RESPIRATORY DATA:  The patient was found to have 130 apneas and 17 obstructive hypopneas, giving him an apnea/hypopnea index of 30 events per hour.  The events were more common in the supine position and there was mild snoring noted throughout.  OXYGEN DATA:  There was O2 desaturation as low as 85% with the patient's obstructive events.  CARDIAC DATA:  Occasional PVC noted, but no clinically significant arrhythmias were seen.  MOVEMENT/PARASOMNIA:  The patient had no significant leg jerks or other abnormal behaviors noted.  IMPRESSION/RECOMMENDATION: 1. Moderate obstructive sleep apnea/hypopnea syndrome with an AHI of     30 events per hour and oxygen desaturation as low as 85%.     Treatment for this degree of sleep apnea should focus primarily on     CPAP and     possibly mild weight loss. 2. Occasional premature ventricular contraction noted, but no     clinically significant arrhythmias were seen.     Barbaraann Share, MD,FCCP Diplomate, American Board of Sleep Medicine    [facility]/MEDQ  D:  09/11/2012 08:13:33  T:  09/11/2012 09:58:26  Job:  [id number]

## 2012-09-14 ENCOUNTER — Encounter: Payer: Self-pay | Admitting: Family Medicine

## 2012-09-14 DIAGNOSIS — G4733 Obstructive sleep apnea (adult) (pediatric): Secondary | ICD-10-CM | POA: Insufficient documentation

## 2012-09-19 ENCOUNTER — Ambulatory Visit (INDEPENDENT_AMBULATORY_CARE_PROVIDER_SITE_OTHER): Payer: BC Managed Care – PPO | Admitting: Pulmonary Disease

## 2012-09-19 ENCOUNTER — Encounter: Payer: Self-pay | Admitting: Pulmonary Disease

## 2012-09-19 VITALS — BP 108/68 | HR 71 | Temp 98.3°F | Ht 70.5 in | Wt 192.2 lb

## 2012-09-19 DIAGNOSIS — G4710 Hypersomnia, unspecified: Secondary | ICD-10-CM

## 2012-09-19 DIAGNOSIS — G4733 Obstructive sleep apnea (adult) (pediatric): Secondary | ICD-10-CM

## 2012-09-19 NOTE — Progress Notes (Signed)
  Subjective:    Patient ID: Ian Bowen, male    DOB: [birth year], 61 y.o.   MRN: [id number]  HPI Patient comes in today for followup after a recent sleep study to evaluate his daytime sleepiness.  He was found to have moderate obstructive sleep apnea, with an AHI of 30 events per hour and oxygen desaturation as low as 85%.  I have reviewed the study with him in detail, and answered all of his questions.  I have also looked at his sleep diaries, and part of the patient's problem is that he will often work until one or 2:00 in the morning.  This will obviously limit his total sleep time, and contributes to his daytime sleepiness as well.   Review of Systems  Constitutional: Negative for fever and unexpected weight change.  HENT: Positive for congestion, rhinorrhea and postnasal drip. Negative for ear pain, nosebleeds, sore throat, sneezing, trouble swallowing, dental problem and sinus pressure.   Eyes: Negative for redness and itching.  Respiratory: Positive for cough. Negative for chest tightness, shortness of breath and wheezing.   Cardiovascular: Negative for palpitations and leg swelling.  Gastrointestinal: Negative for nausea and vomiting.  Genitourinary: Negative for dysuria.  Musculoskeletal: Negative for joint swelling.  Skin: Negative for rash.  Neurological: Negative for headaches.  Hematological: Does not bruise/bleed easily.  Psychiatric/Behavioral: Negative for dysphoric mood. The patient is not nervous/anxious.        Objective:   Physical Exam Overweight male in no acute distress Nose without purulence or discharge noted Neck without lymphadenopathy or thyromegaly Lower extremities without edema, cyanosis Alert, does not appear to be overly sleepy, moves all 4 extremities.       Assessment & Plan:

## 2012-09-19 NOTE — Assessment & Plan Note (Signed)
The patient has moderate obstructive sleep apnea by his recent study, and I have reviewed the various treatment options with him.  I certainly think he will benefit from modest weight loss, and he can consider a dental appliance for CPAP for more definitive treatment.  He is in a gray zone for successful treatment with a dental appliance, and I have recommended that he initially starts CPAP to see how he responds.  He is concerned about all of his traveling, and would like to investigate whether he can use CPAP while at home, and a dental appliance while traveling.  He is willing to start on CPAP initially, and will also refer him to dental medicine. I will set the patient up on cpap at a moderate pressure level to allow for desensitization, and will troubleshoot the device over the next 4-6weeks if needed.  The pt is to call me if having issues with tolerance.  Will then optimize the pressure once patient is able to wear cpap on a consistent basis.

## 2012-09-19 NOTE — Assessment & Plan Note (Signed)
I suspect the majority of the patient's daytime hypersomnia is related to his obstructive sleep apnea, however his short total sleep time on many days noted on his sleep diaries is also contributing.  I have asked him to try and get as much sleep as possible, but unfortunately his job requires him to be on call 24 hours a day every day.  He states that he will do his best.  The patient also has noted some sleepiness dating back even when he was in his young adulthood.  This certainly would raise the question about a daytime sleep disorder such as narcolepsy or idiopathic hypersomnia.  I suspect these are unlikely, but would consider further evaluation if he has persistent severe daytime hypersomnia despite treating his sleep apnea.

## 2012-09-19 NOTE — Patient Instructions (Addendum)
Will start on cpap at a moderate level to initiate treatment for your sleep apnea.  Please call me if having issues with tolerance. Will refer you to dental medicine once we receive the name of your local provider. Work on modest weight loss.  Try to increase your total sleep time as much as possible If doing well, will see you back in 6 weeks to check on progress.

## 2012-09-24 ENCOUNTER — Telehealth: Payer: Self-pay | Admitting: Pulmonary Disease

## 2012-09-24 ENCOUNTER — Ambulatory Visit (INDEPENDENT_AMBULATORY_CARE_PROVIDER_SITE_OTHER): Payer: BC Managed Care – PPO | Admitting: *Deleted

## 2012-09-24 ENCOUNTER — Other Ambulatory Visit: Payer: Self-pay | Admitting: Pulmonary Disease

## 2012-09-24 DIAGNOSIS — Z7901 Long term (current) use of anticoagulants: Secondary | ICD-10-CM

## 2012-09-24 DIAGNOSIS — Z954 Presence of other heart-valve replacement: Secondary | ICD-10-CM

## 2012-09-24 DIAGNOSIS — G4733 Obstructive sleep apnea (adult) (pediatric): Secondary | ICD-10-CM

## 2012-09-24 DIAGNOSIS — Z952 Presence of prosthetic heart valve: Secondary | ICD-10-CM

## 2012-09-24 DIAGNOSIS — I4891 Unspecified atrial fibrillation: Secondary | ICD-10-CM

## 2012-09-24 NOTE — Telephone Encounter (Signed)
Pt is aware this has been ordered and to ca;; if he doesn't hear anything in the next week or so. Pt verbalized understanding.

## 2012-09-24 NOTE — Telephone Encounter (Signed)
Will forward to [facility] as FYI from patient.

## 2012-09-24 NOTE — Telephone Encounter (Signed)
Will send an order to [facility] for this.

## 2012-10-23 ENCOUNTER — Ambulatory Visit (INDEPENDENT_AMBULATORY_CARE_PROVIDER_SITE_OTHER): Payer: BC Managed Care – PPO

## 2012-10-23 DIAGNOSIS — Z954 Presence of other heart-valve replacement: Secondary | ICD-10-CM

## 2012-10-23 DIAGNOSIS — Z7901 Long term (current) use of anticoagulants: Secondary | ICD-10-CM

## 2012-10-23 DIAGNOSIS — I4891 Unspecified atrial fibrillation: Secondary | ICD-10-CM

## 2012-10-23 DIAGNOSIS — Z952 Presence of prosthetic heart valve: Secondary | ICD-10-CM

## 2012-10-31 ENCOUNTER — Encounter: Payer: Self-pay | Admitting: Pulmonary Disease

## 2012-10-31 ENCOUNTER — Ambulatory Visit (INDEPENDENT_AMBULATORY_CARE_PROVIDER_SITE_OTHER): Payer: BC Managed Care – PPO | Admitting: Pulmonary Disease

## 2012-10-31 VITALS — HR 80 | Temp 97.6°F | Ht 70.5 in | Wt 202.8 lb

## 2012-10-31 DIAGNOSIS — G4733 Obstructive sleep apnea (adult) (pediatric): Secondary | ICD-10-CM

## 2012-10-31 NOTE — Progress Notes (Signed)
  Subjective:    Patient ID: Ian Bowen, male    DOB: [birth year], 62 y.o.   MRN: [id number]  HPI Patient comes in today for followup of his obstructive sleep apnea.  He is wearing CPAP compliantly, and the plan is to use a dental appliance when he travels.  This is being made by his dentist.  He is having no significant problems with his mask or pressure, but is having some air gulping with increased flatulence.  He feels this has helped his sleep and daytime alertness.   Review of Systems  Constitutional: Negative for fever and unexpected weight change.  HENT: Negative for ear pain, nosebleeds, congestion, sore throat, rhinorrhea, sneezing, trouble swallowing, dental problem, postnasal drip and sinus pressure.   Eyes: Negative for redness and itching.  Respiratory: Negative for cough, chest tightness, shortness of breath and wheezing.   Cardiovascular: Negative for palpitations and leg swelling.  Gastrointestinal: Negative for nausea and vomiting.  Genitourinary: Negative for dysuria.  Musculoskeletal: Negative for joint swelling.  Skin: Negative for rash.  Neurological: Negative for headaches.  Hematological: Does not bruise/bleed easily.  Psychiatric/Behavioral: Negative for dysphoric mood. The patient is not nervous/anxious.        Objective:   Physical Exam Well-developed male in no acute distress Nose without purulence or discharge noted  No skin breakdown or pressure necrosis from the CPAP mask Neck without lymphadenopathy or thyromegaly Lower extremities without edema, no cyanosis Alert and oriented, moves all 4 extremities.  Does not appear to be sleepy.        Assessment & Plan:

## 2012-10-31 NOTE — Patient Instructions (Addendum)
Will put your machine on auto for the next 2 weeks to optimize your pressure, and also to see if you like this mode better.  Let me know if you want to stay on auto. Work on weight loss followup with me in 6mos if doing well.

## 2012-10-31 NOTE — Assessment & Plan Note (Signed)
The patient has seen improvement in his sleep and daytime alertness with CPAP, and overall is tolerating well.  At this point, we need to optimize his CPAP pressure, and we'll also see if he has less air gulping being on the automatic setting.  He is also having a dental appliance made to use when he travels, and he is to let me know how that goes.  I have also encouraged him to work on weight loss.

## 2012-11-07 ENCOUNTER — Telehealth: Payer: Self-pay | Admitting: Pulmonary Disease

## 2012-11-07 DIAGNOSIS — G4733 Obstructive sleep apnea (adult) (pediatric): Secondary | ICD-10-CM

## 2012-11-07 NOTE — Telephone Encounter (Signed)
I spoke with pt and he stated he can not sleep with the auto setting. When it ramps up the mask leaks bad. He has had to shut the machine off about 3 times since being on auto setting. He has tried tightening the mask so tight to where he couldn't breathe. He knows it's not the mask bc he just got it. Please advise KC thanks

## 2012-11-07 NOTE — Telephone Encounter (Signed)
Let him know it ramps up because he probably needs that level of pressure to treat his sleep apnea at that particular incident.  The mask fit is even more critical at higher pressures.  We can limit the upside pressure on the auto, and just accept that it may miss some events.  I don't think this is necessarily a big deal.  See if he is willing to try the auto on 5-15 instead of 5-20, and see if he can do better on this. May have to consider a better fitting mask for the higher pressures???.

## 2012-11-07 NOTE — Telephone Encounter (Signed)
Spoke with pt and notified of recs per [facility] Pt verbalized understanding and states nothing further recs  I have sent order to [facility]

## 2012-11-20 ENCOUNTER — Ambulatory Visit (INDEPENDENT_AMBULATORY_CARE_PROVIDER_SITE_OTHER): Payer: BC Managed Care – PPO | Admitting: *Deleted

## 2012-11-20 DIAGNOSIS — I4891 Unspecified atrial fibrillation: Secondary | ICD-10-CM

## 2012-11-20 DIAGNOSIS — Z952 Presence of prosthetic heart valve: Secondary | ICD-10-CM

## 2012-11-20 DIAGNOSIS — Z7901 Long term (current) use of anticoagulants: Secondary | ICD-10-CM

## 2012-11-20 DIAGNOSIS — Z954 Presence of other heart-valve replacement: Secondary | ICD-10-CM

## 2012-11-20 LAB — POCT INR: INR: 3.1

## 2012-12-20 ENCOUNTER — Ambulatory Visit (INDEPENDENT_AMBULATORY_CARE_PROVIDER_SITE_OTHER): Payer: BC Managed Care – PPO

## 2012-12-20 DIAGNOSIS — Z954 Presence of other heart-valve replacement: Secondary | ICD-10-CM

## 2012-12-20 DIAGNOSIS — I4891 Unspecified atrial fibrillation: Secondary | ICD-10-CM

## 2012-12-20 DIAGNOSIS — Z7901 Long term (current) use of anticoagulants: Secondary | ICD-10-CM

## 2012-12-20 DIAGNOSIS — Z952 Presence of prosthetic heart valve: Secondary | ICD-10-CM

## 2012-12-20 LAB — POCT INR: INR: 1.7

## 2013-01-03 ENCOUNTER — Ambulatory Visit (INDEPENDENT_AMBULATORY_CARE_PROVIDER_SITE_OTHER): Payer: BC Managed Care – PPO

## 2013-01-03 DIAGNOSIS — Z7901 Long term (current) use of anticoagulants: Secondary | ICD-10-CM

## 2013-01-03 DIAGNOSIS — Z952 Presence of prosthetic heart valve: Secondary | ICD-10-CM

## 2013-01-03 DIAGNOSIS — I4891 Unspecified atrial fibrillation: Secondary | ICD-10-CM

## 2013-01-03 DIAGNOSIS — Z954 Presence of other heart-valve replacement: Secondary | ICD-10-CM

## 2013-01-03 LAB — POCT INR: INR: 1.3

## 2013-01-14 ENCOUNTER — Ambulatory Visit (INDEPENDENT_AMBULATORY_CARE_PROVIDER_SITE_OTHER): Payer: BC Managed Care – PPO | Admitting: Pharmacist

## 2013-01-14 DIAGNOSIS — Z7901 Long term (current) use of anticoagulants: Secondary | ICD-10-CM

## 2013-01-14 DIAGNOSIS — I4891 Unspecified atrial fibrillation: Secondary | ICD-10-CM

## 2013-01-14 DIAGNOSIS — Z952 Presence of prosthetic heart valve: Secondary | ICD-10-CM

## 2013-01-14 DIAGNOSIS — Z954 Presence of other heart-valve replacement: Secondary | ICD-10-CM

## 2013-01-14 LAB — POCT INR: INR: 2.5

## 2013-02-05 ENCOUNTER — Ambulatory Visit (INDEPENDENT_AMBULATORY_CARE_PROVIDER_SITE_OTHER): Payer: BC Managed Care – PPO

## 2013-02-05 DIAGNOSIS — Z7901 Long term (current) use of anticoagulants: Secondary | ICD-10-CM

## 2013-02-05 DIAGNOSIS — I4891 Unspecified atrial fibrillation: Secondary | ICD-10-CM

## 2013-02-05 DIAGNOSIS — Z954 Presence of other heart-valve replacement: Secondary | ICD-10-CM

## 2013-02-05 DIAGNOSIS — Z952 Presence of prosthetic heart valve: Secondary | ICD-10-CM

## 2013-02-06 ENCOUNTER — Other Ambulatory Visit: Payer: Self-pay | Admitting: Cardiology

## 2013-03-05 ENCOUNTER — Ambulatory Visit (INDEPENDENT_AMBULATORY_CARE_PROVIDER_SITE_OTHER): Payer: BC Managed Care – PPO | Admitting: *Deleted

## 2013-03-05 DIAGNOSIS — Z954 Presence of other heart-valve replacement: Secondary | ICD-10-CM

## 2013-03-05 DIAGNOSIS — Z952 Presence of prosthetic heart valve: Secondary | ICD-10-CM

## 2013-03-05 DIAGNOSIS — Z7901 Long term (current) use of anticoagulants: Secondary | ICD-10-CM

## 2013-03-05 DIAGNOSIS — I4891 Unspecified atrial fibrillation: Secondary | ICD-10-CM

## 2013-03-05 LAB — POCT INR: INR: 2.1

## 2013-03-14 ENCOUNTER — Other Ambulatory Visit: Payer: Self-pay | Admitting: Cardiology

## 2013-03-14 DIAGNOSIS — E785 Hyperlipidemia, unspecified: Secondary | ICD-10-CM

## 2013-03-14 DIAGNOSIS — I10 Essential (primary) hypertension: Secondary | ICD-10-CM

## 2013-03-14 DIAGNOSIS — Z952 Presence of prosthetic heart valve: Secondary | ICD-10-CM

## 2013-03-14 DIAGNOSIS — I5031 Acute diastolic (congestive) heart failure: Secondary | ICD-10-CM

## 2013-03-14 DIAGNOSIS — R1013 Epigastric pain: Secondary | ICD-10-CM

## 2013-03-14 DIAGNOSIS — I059 Rheumatic mitral valve disease, unspecified: Secondary | ICD-10-CM

## 2013-03-14 DIAGNOSIS — I4891 Unspecified atrial fibrillation: Secondary | ICD-10-CM

## 2013-03-14 MED ORDER — TADALAFIL 5 MG PO TABS: 5.0000 mg | ORAL_TABLET | Freq: Every day | ORAL | Status: DC | PRN

## 2013-03-14 MED ORDER — METOPROLOL SUCCINATE ER 50 MG PO TB24: ORAL_TABLET | ORAL | Status: DC

## 2013-03-16 ENCOUNTER — Other Ambulatory Visit: Payer: Self-pay | Admitting: Family Medicine

## 2013-03-16 DIAGNOSIS — I4891 Unspecified atrial fibrillation: Secondary | ICD-10-CM

## 2013-03-16 DIAGNOSIS — I10 Essential (primary) hypertension: Secondary | ICD-10-CM

## 2013-03-16 DIAGNOSIS — E785 Hyperlipidemia, unspecified: Secondary | ICD-10-CM

## 2013-03-16 DIAGNOSIS — Z125 Encounter for screening for malignant neoplasm of prostate: Secondary | ICD-10-CM

## 2013-03-18 ENCOUNTER — Other Ambulatory Visit (INDEPENDENT_AMBULATORY_CARE_PROVIDER_SITE_OTHER): Payer: BC Managed Care – PPO

## 2013-03-18 DIAGNOSIS — I10 Essential (primary) hypertension: Secondary | ICD-10-CM

## 2013-03-18 DIAGNOSIS — Z125 Encounter for screening for malignant neoplasm of prostate: Secondary | ICD-10-CM

## 2013-03-18 DIAGNOSIS — E785 Hyperlipidemia, unspecified: Secondary | ICD-10-CM

## 2013-03-18 DIAGNOSIS — I4891 Unspecified atrial fibrillation: Secondary | ICD-10-CM

## 2013-03-18 LAB — LIPID PANEL
HDL: 56.4 mg/dL (ref >39.00)
VLDL: 11.4 mg/dL (ref 0.0–40.0)

## 2013-03-18 LAB — CBC WITH DIFFERENTIAL/PLATELET
Basophils Relative: 1.6 % (ref 0.0–3.0)
Eosinophils Relative: 6.3 % — ABNORMAL HIGH (ref 0.0–5.0)
HCT: 42.8 % (ref 39.0–52.0)
Hemoglobin: 14.8 g/dL (ref 13.0–17.0)
Lymphs Abs: 1.1 10*3/uL (ref 0.7–4.0)
MCV: 94.7 fl (ref 78.0–100.0)
Monocytes Absolute: 0.5 10*3/uL (ref 0.1–1.0)
Neutro Abs: 2.4 10*3/uL (ref 1.4–7.7)
Platelets: 236 10*3/uL (ref 150.0–400.0)
WBC: 4.4 10*3/uL — ABNORMAL LOW (ref 4.5–10.5)

## 2013-03-18 LAB — COMPREHENSIVE METABOLIC PANEL
AST: 24 U/L (ref 0–37)
Albumin: 4.1 g/dL (ref 3.5–5.2)
Alkaline Phosphatase: 39 U/L (ref 39–117)
BUN: 19 mg/dL (ref 6–23)
Potassium: 4.2 mEq/L (ref 3.5–5.1)
Sodium: 141 mEq/L (ref 135–145)
Total Bilirubin: 0.9 mg/dL (ref 0.3–1.2)

## 2013-03-21 ENCOUNTER — Ambulatory Visit (INDEPENDENT_AMBULATORY_CARE_PROVIDER_SITE_OTHER): Payer: BC Managed Care – PPO | Admitting: Family Medicine

## 2013-03-21 ENCOUNTER — Encounter: Payer: Self-pay | Admitting: Family Medicine

## 2013-03-21 VITALS — BP 110/70 | HR 60 | Temp 98.2°F | Ht 70.0 in | Wt 190.0 lb

## 2013-03-21 DIAGNOSIS — Z0000 Encounter for general adult medical examination without abnormal findings: Secondary | ICD-10-CM

## 2013-03-21 DIAGNOSIS — I4891 Unspecified atrial fibrillation: Secondary | ICD-10-CM

## 2013-03-21 DIAGNOSIS — E785 Hyperlipidemia, unspecified: Secondary | ICD-10-CM

## 2013-03-21 DIAGNOSIS — G4733 Obstructive sleep apnea (adult) (pediatric): Secondary | ICD-10-CM

## 2013-03-21 DIAGNOSIS — I10 Essential (primary) hypertension: Secondary | ICD-10-CM

## 2013-03-21 MED ORDER — METOPROLOL SUCCINATE ER 50 MG PO TB24: ORAL_TABLET | ORAL | Status: DC

## 2013-03-21 MED ORDER — METOPROLOL SUCCINATE ER 25 MG PO TB24: 75.0000 mg | ORAL_TABLET | Freq: Two times a day (BID) | ORAL | Status: DC

## 2013-03-21 NOTE — Assessment & Plan Note (Signed)
Rate controlled, on anticoagulation. Lab Results  Component Value Date   INR 2.1 03/05/2013   INR 3.5 02/05/2013   INR 2.5 01/14/2013

## 2013-03-21 NOTE — Patient Instructions (Addendum)
Sign release form for records of colonoscopy from [place], [place] in 2011. Call your insurance about the shingles shot to see if it is covered or how much it would cost and where is cheaper (here or pharmacy).  If you want to receive here, call for nurse visit.  Return in 1 year for next physical or as needed. Good to see you today, call us with questions.

## 2013-03-21 NOTE — Assessment & Plan Note (Signed)
LDL elevated this year - encouraged closer adherence to low chol diet, if persistently elevated will need to increase potency of statin - lipitor vs crestor.  Pt understands.

## 2013-03-21 NOTE — Assessment & Plan Note (Signed)
Improved significantly on CPAP, compliant.

## 2013-03-21 NOTE — Assessment & Plan Note (Signed)
Preventative protocols reviewed and updated unless pt declined. Discussed healthy diet and lifestyle.  Declines pneumovax.  Will check on shingles shot.

## 2013-03-21 NOTE — Assessment & Plan Note (Signed)
Chronic, stable. Continue meds. 

## 2013-03-21 NOTE — Progress Notes (Signed)
Subjective:    Patient ID: Ian Bowen, male    DOB: [birth year], 62 y.o.   MRN: [id number]  HPI CC: CPE   Meds reviewed. Seat belt use discussed. Sunscreen use discussed.  OSA - on CPAP, notes improvement with this.  Sees Dr. Shelle Iron.  Preventative: Colonoscopy done 2011. Td 2011. Flu shot - doesn't take.  Got sick last time he got flu shot. zostavax - discussed.  Caffeine: 2 cups soda/day Lives alone, no pets, s/p 2 divorces Occupation: Regulatory affairs officer for [employer] Activity: stays active at work, no regular exercise Diet: vegetables daily, trying to eat more healthy  Medications and allergies reviewed and updated in chart.  Past histories reviewed and updated if relevant as below. Patient Active Problem List   Diagnosis Date Noted  . OSA (obstructive sleep apnea)   . Left elbow pain 07/24/2012  . Hypersomnia 07/20/2012  . Apnea, sleep 06/22/2012  . Right ankle injury 09/23/2011  . Cardiomyopathy 07/21/2011  . GERD (gastroesophageal reflux disease)   . CHF (congestive heart failure) 07/12/2011  . S/P mitral valve replacement 07/12/2011  . Atrial fibrillation 07/12/2011  . Hypertension 07/12/2011  . Hyperlipidemia 07/12/2011  . Epigastric pain 07/12/2011  . Encounter for long-term (current) use of anticoagulants 07/12/2011   Past Medical History  Diagnosis Date  . Dyslipidemia   . S/P MVR (mitral valve replacement) [facility] in [place]  . Malfunction of mitral prosthetic valve     Paravalvular leak repaired in [place]  . HTN (hypertension)   . PAF (paroxysmal atrial fibrillation)   . Mitral valve prolapse     s/p replacement  . GERD (gastroesophageal reflux disease)   . ED (erectile dysfunction)   . Cardiomyopathy   . OSA (obstructive sleep apnea)    Past Surgical History  Procedure Laterality Date  . Mitral valve replacement  2002    with MVR repair 2006 for leaking valve  . Cardioversion  2006  . Right knee surgery    . Left  arthroscopic knee surgery    . 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 2d echo  07/12/2011    Normal LV size with severe global hypokinesis, EF 20%., mild pHTN, mechanical mitral valve functioning normally  . Retinal surx      OD  . Colonoscopy  2011    per pt report normal   History  Substance Use Topics  . Smoking status: Never Smoker   . Smokeless tobacco: Never Used  . Alcohol Use: No     Comment: previously wine/beer (bottle wine nightly).  stopped with results of echo   Family History  Problem Relation Age of Onset  . Heart attack Father     died of MI at age 60  . Coronary artery disease Father   . Cancer Mother     spleen cancer  . Coronary artery disease Paternal Uncle   . Coronary artery disease Paternal Grandfather   . Stroke Neg Hx   . Diabetes Neg Hx    No Known Allergies Current Outpatient Prescriptions on File Prior to Visit  Medication Sig Dispense Refill  . aspirin 81 MG tablet Take 81 mg by mouth daily.        . furosemide (LASIX) 20 MG tablet Take 1 tablet (20 mg total) by mouth daily.  90 tablet  3  . lisinopril (PRINIVIL,ZESTRIL) 5 MG tablet Take 5 mg by mouth daily.      Marland Kitchen  lovastatin (MEVACOR) 20 MG tablet TAKE 1 TABLET AT BEDTIME  90 tablet  3  . omeprazole (PRILOSEC) 10 MG capsule Take 10 mg by mouth daily.        . tadalafil (CIALIS) 5 MG tablet Take 1 tablet (5 mg total) by mouth daily as needed.  90 tablet  1  . warfarin (COUMADIN) 5 MG tablet Take 5 mg by mouth as directed. 7.5mg  4d a week, 5mg  3d a week      . warfarin (COUMADIN) 5 MG tablet Take as directed by anticoagulation clinic  120 tablet  1   No current facility-administered medications on file prior to visit.     Review of Systems  Constitutional: Negative for fever, chills, activity change, appetite change, fatigue and unexpected weight change.  HENT: Negative for hearing loss and neck pain.   Eyes: Negative for visual disturbance.  Respiratory: Negative for cough,  chest tightness, shortness of breath and wheezing.   Cardiovascular: Negative for chest pain, palpitations and leg swelling.  Gastrointestinal: Negative for nausea, vomiting, abdominal pain, diarrhea, constipation, blood in stool and abdominal distention.  Genitourinary: Negative for hematuria and difficulty urinating.  Musculoskeletal: Negative for myalgias and arthralgias.  Skin: Negative for rash.  Neurological: Negative for dizziness, seizures, syncope and headaches.  Hematological: Negative for adenopathy. Does not bruise/bleed easily.  Psychiatric/Behavioral: Negative for dysphoric mood. The patient is not nervous/anxious.        Objective:   Physical Exam  Nursing note and vitals reviewed. Constitutional: He is oriented to person, place, and time. He appears well-developed and well-nourished. No distress.  HENT:  Head: Normocephalic and atraumatic.  Right Ear: External ear normal.  Left Ear: External ear normal.  Nose: Nose normal.  Mouth/Throat: Oropharynx is clear and moist. No oropharyngeal exudate.  Eyes: Conjunctivae and EOM are normal. Pupils are equal, round, and reactive to light. No scleral icterus.  Neck: Normal range of motion. Neck supple. No thyromegaly present.  Cardiovascular: Normal rate and intact distal pulses.  An irregularly irregular rhythm present.  Murmur (mechanical click) heard.  Systolic murmur is present with a grade of 3/6  Pulses:      Radial pulses are 2+ on the right side, and 2+ on the left side.  Pulmonary/Chest: Effort normal and breath sounds normal. No respiratory distress. He has no wheezes. He has no rales.  Abdominal: Soft. Bowel sounds are normal. He exhibits no distension and no mass. There is no tenderness. There is no rebound and no guarding.  Genitourinary: Rectum normal. Rectal exam shows no external hemorrhoid, no internal hemorrhoid, no fissure, no mass, no tenderness and anal tone normal. Prostate is enlarged (~20gm). Prostate is  not tender.  Musculoskeletal: Normal range of motion. He exhibits no edema.  Lymphadenopathy:    He has no cervical adenopathy.  Neurological: He is alert and oriented to person, place, and time.  CN grossly intact, station and gait intact  Skin: Skin is warm and dry. No rash noted.  Psychiatric: He has a normal mood and affect. His behavior is normal. Judgment and thought content normal.       Assessment & Plan:

## 2013-03-27 ENCOUNTER — Ambulatory Visit (INDEPENDENT_AMBULATORY_CARE_PROVIDER_SITE_OTHER): Payer: BC Managed Care – PPO | Admitting: *Deleted

## 2013-03-27 DIAGNOSIS — Z7901 Long term (current) use of anticoagulants: Secondary | ICD-10-CM

## 2013-03-27 DIAGNOSIS — Z954 Presence of other heart-valve replacement: Secondary | ICD-10-CM

## 2013-03-27 DIAGNOSIS — Z952 Presence of prosthetic heart valve: Secondary | ICD-10-CM

## 2013-03-27 DIAGNOSIS — I4891 Unspecified atrial fibrillation: Secondary | ICD-10-CM

## 2013-03-27 LAB — POCT INR: INR: 2.7

## 2013-04-16 ENCOUNTER — Ambulatory Visit (INDEPENDENT_AMBULATORY_CARE_PROVIDER_SITE_OTHER): Payer: BC Managed Care – PPO | Admitting: *Deleted

## 2013-04-16 ENCOUNTER — Ambulatory Visit (INDEPENDENT_AMBULATORY_CARE_PROVIDER_SITE_OTHER): Payer: BC Managed Care – PPO | Admitting: Cardiology

## 2013-04-16 ENCOUNTER — Encounter: Payer: Self-pay | Admitting: Cardiology

## 2013-04-16 VITALS — BP 113/75 | HR 92 | Wt 189.0 lb

## 2013-04-16 DIAGNOSIS — I428 Other cardiomyopathies: Secondary | ICD-10-CM

## 2013-04-16 DIAGNOSIS — Z7901 Long term (current) use of anticoagulants: Secondary | ICD-10-CM

## 2013-04-16 DIAGNOSIS — Z954 Presence of other heart-valve replacement: Secondary | ICD-10-CM

## 2013-04-16 DIAGNOSIS — I4891 Unspecified atrial fibrillation: Secondary | ICD-10-CM

## 2013-04-16 DIAGNOSIS — I429 Cardiomyopathy, unspecified: Secondary | ICD-10-CM

## 2013-04-16 DIAGNOSIS — I059 Rheumatic mitral valve disease, unspecified: Secondary | ICD-10-CM

## 2013-04-16 DIAGNOSIS — E785 Hyperlipidemia, unspecified: Secondary | ICD-10-CM

## 2013-04-16 DIAGNOSIS — I10 Essential (primary) hypertension: Secondary | ICD-10-CM

## 2013-04-16 DIAGNOSIS — Z952 Presence of prosthetic heart valve: Secondary | ICD-10-CM

## 2013-04-16 LAB — POCT INR: INR: 2

## 2013-04-16 MED ORDER — LISINOPRIL 5 MG PO TABS: 5.0000 mg | ORAL_TABLET | Freq: Every day | ORAL | Status: DC

## 2013-04-16 NOTE — Assessment & Plan Note (Signed)
LV function improved on most recent echo. Continue ACE inhibitor and beta blocker. 

## 2013-04-16 NOTE — Progress Notes (Signed)
HPI: Pleasant male for fu of cardiomyopathy and atrial fibrilllation. Patient underwent mitral valve replacement with a [employer] bileaflet valve in September of 2002. Note preoperative cardiac catheterization showed normal coronary arteries and normal LV function. He apparently had subsequent repair in 2006 due to paravalvular leak. Transesophageal echocardiogram in May 2009 showed normally functioning mechanical mitral valve with no leak. LV function was normal. Patient also has a history of paroxysmal atrial fibrillation. He was seen at [facility] in September of 2012 with an episode of atrial fibrillation. TSH was normal. LFTs were mildly elevated. When he was seen at [facility] his heart rate was 150. The patient was felt to also have congestive heart failure. Cardizem was added to his medical regimen as was Lasix and potassium. A Myoview was performed in September of 2012 and showed inferior apical infarct with minimal reversibility. Ejection fraction 18%. Echocardiogram in September of 2012 showed an ejection fraction of 20%, diffuse hypokinesis, normally functioning prosthetic mitral valve, biatrial enlargement, right ventricular enlargement with decreased function. We felt his cardiomyopathy may be tachycardia mediated vs ETOH. Holter in Oct 2012 showed his rate mildly increased and we increased his toprol. Echo repeated in April 2013 and revealed EF 45-50, MVR with mean gradient of 7 mmHg, trace AI, biatrial enlargement and mild RVE; small mobile density associated with MVR; ? Residual MV apparatus; since I last saw him in July of 2013, the patient denies any dyspnea on exertion, orthopnea, PND, pedal edema, palpitations, syncope or chest pain.    Current Outpatient Prescriptions  Medication Sig Dispense Refill  . aspirin 81 MG tablet Take 81 mg by mouth daily.        . furosemide (LASIX) 20 MG tablet Take 1 tablet (20 mg total) by mouth daily.  90 tablet  3  . lisinopril  (PRINIVIL,ZESTRIL) 5 MG tablet Take 5 mg by mouth daily.      Marland Kitchen lovastatin (MEVACOR) 20 MG tablet TAKE 1 TABLET AT BEDTIME  90 tablet  3  . metoprolol succinate (TOPROL-XL) 50 MG 24 hr tablet Take 75mg  twice daily  24 tablet  0  . omeprazole (PRILOSEC) 10 MG capsule Take 10 mg by mouth daily.        . tadalafil (CIALIS) 5 MG tablet Take 1 tablet (5 mg total) by mouth daily as needed.  90 tablet  1  . warfarin (COUMADIN) 5 MG tablet Take as directed by anticoagulation clinic  120 tablet  1   No current facility-administered medications for this visit.     Past Medical History  Diagnosis Date  . Dyslipidemia   . S/P MVR (mitral valve replacement) [facility] in [place]  . Malfunction of mitral prosthetic valve     Paravalvular leak repaired in [place]  . HTN (hypertension)   . PAF (paroxysmal atrial fibrillation)   . Mitral valve prolapse     s/p replacement  . GERD (gastroesophageal reflux disease)   . ED (erectile dysfunction)   . Cardiomyopathy   . OSA (obstructive sleep apnea)     Past Surgical History  Procedure Laterality Date  . Mitral valve replacement  2002    with MVR repair 2006 for leaking valve  . Cardioversion  2006  . Right knee surgery    . Left arthroscopic knee surgery    . 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 2d echo  07/12/2011    Normal LV size with severe global  hypokinesis, EF 20%., mild pHTN, mechanical mitral valve functioning normally  . Retinal surx      OD  . Colonoscopy  2011    per pt report normal    History   Social History  . Marital Status: Single    Spouse Name: N/A    Number of Children: 2  . Years of Education: N/A   Occupational History  .  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 Smoking status: Never Smoker   . Smokeless tobacco: Never Used  . Alcohol Use: No     Comment: previously wine/beer (bottle wine nightly).  stopped with results of echo  . Drug Use: No  . Sexually Active: Not on  file   Other Topics Concern  . Not on file   Social History Narrative   Caffeine: 2 cups soda/day   Lives alone, no pets, s/p 2 divorces   Occupation: Regulatory affairs officer for [employer]   Activity: stays active at work, no regular exercise   Diet: some salads, trying to eat more healthy    ROS: no fevers or chills, productive cough, hemoptysis, dysphasia, odynophagia, melena, hematochezia, dysuria, hematuria, rash, seizure activity, orthopnea, PND, pedal edema, claudication. Remaining systems are negative.  Physical Exam: Well-developed well-nourished in no acute distress.  Skin is warm and dry.  HEENT is normal.  Neck is supple.  Chest is clear to auscultation with normal expansion.  Cardiovascular exam is irregular, Chrisp mechanical valve sound Abdominal exam nontender or distended. No masses palpated. Extremities show no edema. neuro grossly intact  ECG Atrial fibrillation, low voltage

## 2013-04-16 NOTE — Assessment & Plan Note (Signed)
Repeat echocardiogram. Continue SBE prophylaxis. 

## 2013-04-16 NOTE — Assessment & Plan Note (Signed)
Patient has permanent atrial fibrillation. Continue beta blocker and Coumadin.

## 2013-04-16 NOTE — Assessment & Plan Note (Signed)
euvolemic on examination. Continue present medications. 

## 2013-04-16 NOTE — Assessment & Plan Note (Signed)
Management per primary care. 

## 2013-04-16 NOTE — Assessment & Plan Note (Signed)
Continue present blood pressure medications. 

## 2013-04-16 NOTE — Patient Instructions (Addendum)

## 2013-04-18 ENCOUNTER — Telehealth: Payer: Self-pay | Admitting: Cardiology

## 2013-04-18 NOTE — Telephone Encounter (Signed)
New problem   Maron/Medical Reviewer need to speak to a nurse about pt. Please call

## 2013-04-23 NOTE — Telephone Encounter (Signed)
Number provided is a beeper. Called and let number to call back.

## 2013-04-25 NOTE — Telephone Encounter (Signed)
I have beeped this number several time with no response. I will close this encounter.

## 2013-04-29 ENCOUNTER — Telehealth: Payer: Self-pay | Admitting: Cardiology

## 2013-04-29 NOTE — Telephone Encounter (Signed)
New Problem  Pt wants to speak with you but would not tell me what it was concerning.

## 2013-04-29 NOTE — Telephone Encounter (Signed)
Spoke with pt, questions regarding prior auth answered.

## 2013-04-30 ENCOUNTER — Ambulatory Visit (INDEPENDENT_AMBULATORY_CARE_PROVIDER_SITE_OTHER): Payer: BC Managed Care – PPO | Admitting: Pulmonary Disease

## 2013-04-30 ENCOUNTER — Encounter: Payer: Self-pay | Admitting: Pulmonary Disease

## 2013-04-30 VITALS — BP 116/78 | HR 77 | Temp 97.9°F | Ht 70.5 in | Wt 192.6 lb

## 2013-04-30 DIAGNOSIS — G4733 Obstructive sleep apnea (adult) (pediatric): Secondary | ICD-10-CM

## 2013-04-30 NOTE — Patient Instructions (Addendum)
Will get download off your machine on the current setting.  Will call if there are issues. followup with me in one year if doing well, call if having problems with device/sleep

## 2013-04-30 NOTE — Progress Notes (Signed)
  Subjective:    Patient ID: Ian Bowen, male    DOB: [birth year], 62 y.o.   MRN: [id number]  HPI Patient comes in today for followup of his obstructive sleep apnea.  He is wearing CPAP compliantly on the automatic setting, and feels that he is doing well with this.  We did have to limit his upside pressure because he felt it was blowing too hard.  He is using a dental appliance occasionally on weekends whenever he stays with his girlfriend.  He feels this works well during that time, but aggravates his TMJ.   Review of Systems  Constitutional: Negative for fever and unexpected weight change.  HENT: Negative for ear pain, nosebleeds, congestion, sore throat, rhinorrhea, sneezing, trouble swallowing, dental problem, postnasal drip and sinus pressure.   Eyes: Negative for redness and itching.  Respiratory: Positive for cough, shortness of breath and wheezing. Negative for chest tightness.        Pt states symptoms from cold he had x 1 week ago  Cardiovascular: Negative for palpitations and leg swelling.  Gastrointestinal: Negative for nausea and vomiting.  Genitourinary: Negative for dysuria.  Musculoskeletal: Negative for joint swelling.  Skin: Negative for rash.  Neurological: Negative for headaches.  Hematological: Does not bruise/bleed easily.  Psychiatric/Behavioral: Negative for dysphoric mood. The patient is not nervous/anxious.        Objective:   Physical Exam Wd male in nad Nose without purulence or discharge, no skin breakdown or pressure necrosis from cpap mask  Neck without LN or TMG,  LE without edema or cyanosis Alert, does not appear sleepy, moves all 4.        Assessment & Plan:

## 2013-04-30 NOTE — Assessment & Plan Note (Signed)
The patient is doing very well with CPAP on the automatic setting, and feels this has greatly improved his sleep and daytime alertness.  He is using a dental appliance occasionally on weekends when away from home.  I've asked him to continue treating his sleep apnea, and to work on modest weight loss.  He will followup with me in one year if doing well.

## 2013-05-07 ENCOUNTER — Ambulatory Visit (HOSPITAL_COMMUNITY): Payer: BC Managed Care – PPO | Attending: Cardiology | Admitting: Radiology

## 2013-05-07 ENCOUNTER — Ambulatory Visit (INDEPENDENT_AMBULATORY_CARE_PROVIDER_SITE_OTHER): Payer: BC Managed Care – PPO | Admitting: *Deleted

## 2013-05-07 DIAGNOSIS — Z952 Presence of prosthetic heart valve: Secondary | ICD-10-CM

## 2013-05-07 DIAGNOSIS — I4891 Unspecified atrial fibrillation: Secondary | ICD-10-CM | POA: Insufficient documentation

## 2013-05-07 DIAGNOSIS — Z7901 Long term (current) use of anticoagulants: Secondary | ICD-10-CM

## 2013-05-07 DIAGNOSIS — I428 Other cardiomyopathies: Secondary | ICD-10-CM | POA: Insufficient documentation

## 2013-05-07 DIAGNOSIS — I429 Cardiomyopathy, unspecified: Secondary | ICD-10-CM

## 2013-05-07 DIAGNOSIS — Z954 Presence of other heart-valve replacement: Secondary | ICD-10-CM

## 2013-05-07 DIAGNOSIS — I509 Heart failure, unspecified: Secondary | ICD-10-CM | POA: Insufficient documentation

## 2013-05-07 DIAGNOSIS — I059 Rheumatic mitral valve disease, unspecified: Secondary | ICD-10-CM | POA: Insufficient documentation

## 2013-05-07 LAB — POCT INR: INR: 4.7

## 2013-05-07 NOTE — Progress Notes (Signed)
Echocardiogram performed.  

## 2013-05-12 ENCOUNTER — Encounter: Payer: Self-pay | Admitting: Cardiology

## 2013-05-12 DIAGNOSIS — I4891 Unspecified atrial fibrillation: Secondary | ICD-10-CM

## 2013-05-13 MED ORDER — FUROSEMIDE 20 MG PO TABS: 20.0000 mg | ORAL_TABLET | Freq: Every day | ORAL | Status: DC

## 2013-05-28 ENCOUNTER — Ambulatory Visit (INDEPENDENT_AMBULATORY_CARE_PROVIDER_SITE_OTHER): Payer: BC Managed Care – PPO | Admitting: *Deleted

## 2013-05-28 DIAGNOSIS — Z952 Presence of prosthetic heart valve: Secondary | ICD-10-CM

## 2013-05-28 DIAGNOSIS — Z7901 Long term (current) use of anticoagulants: Secondary | ICD-10-CM

## 2013-05-28 DIAGNOSIS — Z954 Presence of other heart-valve replacement: Secondary | ICD-10-CM

## 2013-05-28 DIAGNOSIS — I4891 Unspecified atrial fibrillation: Secondary | ICD-10-CM

## 2013-06-06 ENCOUNTER — Other Ambulatory Visit: Payer: Self-pay | Admitting: Family Medicine

## 2013-06-19 ENCOUNTER — Ambulatory Visit (INDEPENDENT_AMBULATORY_CARE_PROVIDER_SITE_OTHER): Payer: BC Managed Care – PPO | Admitting: *Deleted

## 2013-06-19 DIAGNOSIS — I4891 Unspecified atrial fibrillation: Secondary | ICD-10-CM

## 2013-06-19 DIAGNOSIS — Z952 Presence of prosthetic heart valve: Secondary | ICD-10-CM

## 2013-06-19 DIAGNOSIS — Z954 Presence of other heart-valve replacement: Secondary | ICD-10-CM

## 2013-06-19 DIAGNOSIS — Z7901 Long term (current) use of anticoagulants: Secondary | ICD-10-CM

## 2013-06-24 ENCOUNTER — Telehealth: Payer: Self-pay | Admitting: Pulmonary Disease

## 2013-06-24 DIAGNOSIS — G4733 Obstructive sleep apnea (adult) (pediatric): Secondary | ICD-10-CM

## 2013-06-24 NOTE — Telephone Encounter (Signed)
Results have been explained to patient, pt expressed understanding.  Patient needs card back from [facility]---need to call [facility] and see when this to be mailed back to pt.  Dr Shelle Iron, please advise if you still have pt D/L, I do not have this in scan folder--pt requesting this be scanned into his chart so that it is accessible through MyChart to view.

## 2013-06-24 NOTE — Telephone Encounter (Signed)
Please let pt know that I have reviewed his 90 day download, and appears to be doing well. He is having a few breakthru events due to [place] limiting his upside pressure on the auto setting, but not a lot.  As long as he feels he is doing well, no changes.

## 2013-06-25 NOTE — Telephone Encounter (Signed)
It may be in my scan folder, but if not, it has been thrown away.

## 2013-06-25 NOTE — Telephone Encounter (Signed)
LM with Pau[facility] to have someone give me a call back in regards to pt.  1) Need to find out when pts card to be mailed back to pt---pt is requesting this be mailed back to him ASAP. 2) Need recent D/L refaxed to our office ASAP [phone]

## 2013-07-15 ENCOUNTER — Ambulatory Visit (INDEPENDENT_AMBULATORY_CARE_PROVIDER_SITE_OTHER): Payer: BC Managed Care – PPO | Admitting: *Deleted

## 2013-07-15 DIAGNOSIS — I4891 Unspecified atrial fibrillation: Secondary | ICD-10-CM

## 2013-07-15 DIAGNOSIS — Z7901 Long term (current) use of anticoagulants: Secondary | ICD-10-CM

## 2013-07-15 DIAGNOSIS — Z952 Presence of prosthetic heart valve: Secondary | ICD-10-CM

## 2013-07-15 DIAGNOSIS — Z954 Presence of other heart-valve replacement: Secondary | ICD-10-CM

## 2013-07-15 LAB — POCT INR: INR: 5.7

## 2013-07-18 NOTE — Telephone Encounter (Signed)
Spoke with [facility]--  Card to be mailed back to patient-- they have tried to mail it to patient and it was returned. Wrong address on file. Correct address given, to be mailed again to patient TODAY.  D/L to be faxed to our office ASAP.  LMOM (detail) to make Patient aware.

## 2013-07-18 NOTE — Telephone Encounter (Signed)
Ian Bowen has this been taken care of? Please advise thanks

## 2013-07-22 ENCOUNTER — Ambulatory Visit (INDEPENDENT_AMBULATORY_CARE_PROVIDER_SITE_OTHER): Payer: BC Managed Care – PPO | Admitting: *Deleted

## 2013-07-22 DIAGNOSIS — Z952 Presence of prosthetic heart valve: Secondary | ICD-10-CM

## 2013-07-22 DIAGNOSIS — I4891 Unspecified atrial fibrillation: Secondary | ICD-10-CM

## 2013-07-22 DIAGNOSIS — Z7901 Long term (current) use of anticoagulants: Secondary | ICD-10-CM

## 2013-07-22 DIAGNOSIS — Z954 Presence of other heart-valve replacement: Secondary | ICD-10-CM

## 2013-07-22 LAB — POCT INR: INR: 2.5

## 2013-07-29 ENCOUNTER — Other Ambulatory Visit: Payer: Self-pay | Admitting: Cardiology

## 2013-08-12 ENCOUNTER — Ambulatory Visit (INDEPENDENT_AMBULATORY_CARE_PROVIDER_SITE_OTHER): Payer: BC Managed Care – PPO | Admitting: General Practice

## 2013-08-12 DIAGNOSIS — I4891 Unspecified atrial fibrillation: Secondary | ICD-10-CM

## 2013-08-12 DIAGNOSIS — Z954 Presence of other heart-valve replacement: Secondary | ICD-10-CM

## 2013-08-12 DIAGNOSIS — Z952 Presence of prosthetic heart valve: Secondary | ICD-10-CM

## 2013-08-12 DIAGNOSIS — Z7901 Long term (current) use of anticoagulants: Secondary | ICD-10-CM

## 2013-08-12 LAB — POCT INR: INR: 1.6

## 2013-08-22 ENCOUNTER — Other Ambulatory Visit: Payer: Self-pay

## 2013-08-23 ENCOUNTER — Other Ambulatory Visit: Payer: Self-pay | Admitting: Cardiology

## 2013-08-27 ENCOUNTER — Ambulatory Visit (INDEPENDENT_AMBULATORY_CARE_PROVIDER_SITE_OTHER): Payer: BC Managed Care – PPO | Admitting: *Deleted

## 2013-08-27 DIAGNOSIS — Z954 Presence of other heart-valve replacement: Secondary | ICD-10-CM

## 2013-08-27 DIAGNOSIS — I4891 Unspecified atrial fibrillation: Secondary | ICD-10-CM

## 2013-08-27 DIAGNOSIS — Z952 Presence of prosthetic heart valve: Secondary | ICD-10-CM

## 2013-08-27 DIAGNOSIS — Z7901 Long term (current) use of anticoagulants: Secondary | ICD-10-CM

## 2013-08-27 LAB — POCT INR: INR: 2.4

## 2013-09-10 ENCOUNTER — Ambulatory Visit (INDEPENDENT_AMBULATORY_CARE_PROVIDER_SITE_OTHER): Payer: BC Managed Care – PPO | Admitting: General Practice

## 2013-09-10 DIAGNOSIS — Z952 Presence of prosthetic heart valve: Secondary | ICD-10-CM

## 2013-09-10 DIAGNOSIS — Z954 Presence of other heart-valve replacement: Secondary | ICD-10-CM

## 2013-09-10 DIAGNOSIS — Z7901 Long term (current) use of anticoagulants: Secondary | ICD-10-CM

## 2013-09-10 DIAGNOSIS — I4891 Unspecified atrial fibrillation: Secondary | ICD-10-CM

## 2013-09-10 LAB — POCT INR: INR: 3.3

## 2013-09-30 ENCOUNTER — Ambulatory Visit (INDEPENDENT_AMBULATORY_CARE_PROVIDER_SITE_OTHER): Payer: BC Managed Care – PPO | Admitting: *Deleted

## 2013-09-30 DIAGNOSIS — Z954 Presence of other heart-valve replacement: Secondary | ICD-10-CM

## 2013-09-30 DIAGNOSIS — Z7901 Long term (current) use of anticoagulants: Secondary | ICD-10-CM

## 2013-09-30 DIAGNOSIS — Z952 Presence of prosthetic heart valve: Secondary | ICD-10-CM

## 2013-09-30 DIAGNOSIS — I4891 Unspecified atrial fibrillation: Secondary | ICD-10-CM

## 2013-10-22 ENCOUNTER — Ambulatory Visit (INDEPENDENT_AMBULATORY_CARE_PROVIDER_SITE_OTHER): Payer: BC Managed Care – PPO | Admitting: *Deleted

## 2013-10-22 DIAGNOSIS — Z954 Presence of other heart-valve replacement: Secondary | ICD-10-CM

## 2013-10-22 DIAGNOSIS — I4891 Unspecified atrial fibrillation: Secondary | ICD-10-CM

## 2013-10-22 DIAGNOSIS — Z952 Presence of prosthetic heart valve: Secondary | ICD-10-CM

## 2013-10-22 DIAGNOSIS — Z7901 Long term (current) use of anticoagulants: Secondary | ICD-10-CM

## 2013-10-22 LAB — POCT INR: INR: 1.9

## 2013-11-05 ENCOUNTER — Ambulatory Visit (INDEPENDENT_AMBULATORY_CARE_PROVIDER_SITE_OTHER): Payer: BC Managed Care – PPO | Admitting: *Deleted

## 2013-11-05 DIAGNOSIS — Z952 Presence of prosthetic heart valve: Secondary | ICD-10-CM

## 2013-11-05 DIAGNOSIS — I4891 Unspecified atrial fibrillation: Secondary | ICD-10-CM

## 2013-11-05 DIAGNOSIS — Z954 Presence of other heart-valve replacement: Secondary | ICD-10-CM

## 2013-11-05 DIAGNOSIS — Z7901 Long term (current) use of anticoagulants: Secondary | ICD-10-CM

## 2013-11-05 LAB — POCT INR: INR: 2.8

## 2013-11-06 ENCOUNTER — Other Ambulatory Visit: Payer: Self-pay | Admitting: Family Medicine

## 2013-11-21 ENCOUNTER — Other Ambulatory Visit: Payer: Self-pay | Admitting: Cardiology

## 2013-11-26 ENCOUNTER — Ambulatory Visit (INDEPENDENT_AMBULATORY_CARE_PROVIDER_SITE_OTHER): Payer: BC Managed Care – PPO

## 2013-11-26 DIAGNOSIS — Z7901 Long term (current) use of anticoagulants: Secondary | ICD-10-CM

## 2013-11-26 DIAGNOSIS — Z5181 Encounter for therapeutic drug level monitoring: Secondary | ICD-10-CM | POA: Insufficient documentation

## 2013-11-26 DIAGNOSIS — I4891 Unspecified atrial fibrillation: Secondary | ICD-10-CM

## 2013-11-26 DIAGNOSIS — Z952 Presence of prosthetic heart valve: Secondary | ICD-10-CM

## 2013-11-26 DIAGNOSIS — Z954 Presence of other heart-valve replacement: Secondary | ICD-10-CM

## 2013-11-26 LAB — POCT INR: INR: 2.7

## 2013-12-16 ENCOUNTER — Other Ambulatory Visit: Payer: Self-pay | Admitting: Cardiology

## 2013-12-24 ENCOUNTER — Ambulatory Visit (INDEPENDENT_AMBULATORY_CARE_PROVIDER_SITE_OTHER): Payer: BC Managed Care – PPO

## 2013-12-24 DIAGNOSIS — Z5181 Encounter for therapeutic drug level monitoring: Secondary | ICD-10-CM

## 2013-12-24 DIAGNOSIS — I4891 Unspecified atrial fibrillation: Secondary | ICD-10-CM

## 2013-12-24 DIAGNOSIS — Z952 Presence of prosthetic heart valve: Secondary | ICD-10-CM

## 2013-12-24 DIAGNOSIS — Z954 Presence of other heart-valve replacement: Secondary | ICD-10-CM

## 2013-12-24 DIAGNOSIS — Z7901 Long term (current) use of anticoagulants: Secondary | ICD-10-CM

## 2013-12-24 LAB — POCT INR: INR: 2.7

## 2014-01-05 ENCOUNTER — Other Ambulatory Visit: Payer: Self-pay | Admitting: Cardiology

## 2014-01-07 ENCOUNTER — Encounter (HOSPITAL_COMMUNITY): Payer: Self-pay | Admitting: Emergency Medicine

## 2014-01-07 ENCOUNTER — Telehealth (INDEPENDENT_AMBULATORY_CARE_PROVIDER_SITE_OTHER): Payer: Self-pay | Admitting: General Surgery

## 2014-01-07 ENCOUNTER — Ambulatory Visit (INDEPENDENT_AMBULATORY_CARE_PROVIDER_SITE_OTHER): Payer: BC Managed Care – PPO | Admitting: Internal Medicine

## 2014-01-07 ENCOUNTER — Telehealth: Payer: Self-pay | Admitting: Family Medicine

## 2014-01-07 ENCOUNTER — Emergency Department (HOSPITAL_COMMUNITY)
Admission: EM | Admit: 2014-01-07 | Discharge: 2014-01-07 | Disposition: A | Discharge status: Home / Self Care | Payer: BC Managed Care – PPO | Attending: Emergency Medicine | Admitting: Emergency Medicine

## 2014-01-07 ENCOUNTER — Encounter: Payer: Self-pay | Admitting: Internal Medicine

## 2014-01-07 VITALS — BP 120/80 | HR 97 | Temp 98.3°F | Wt 191.0 lb

## 2014-01-07 DIAGNOSIS — Z954 Presence of other heart-valve replacement: Secondary | ICD-10-CM | POA: Insufficient documentation

## 2014-01-07 DIAGNOSIS — I10 Essential (primary) hypertension: Secondary | ICD-10-CM | POA: Insufficient documentation

## 2014-01-07 DIAGNOSIS — I4891 Unspecified atrial fibrillation: Secondary | ICD-10-CM | POA: Insufficient documentation

## 2014-01-07 DIAGNOSIS — Z7901 Long term (current) use of anticoagulants: Secondary | ICD-10-CM | POA: Insufficient documentation

## 2014-01-07 DIAGNOSIS — K645 Perianal venous thrombosis: Secondary | ICD-10-CM | POA: Insufficient documentation

## 2014-01-07 DIAGNOSIS — Z7982 Long term (current) use of aspirin: Secondary | ICD-10-CM | POA: Insufficient documentation

## 2014-01-07 DIAGNOSIS — E785 Hyperlipidemia, unspecified: Secondary | ICD-10-CM | POA: Insufficient documentation

## 2014-01-07 DIAGNOSIS — Z79899 Other long term (current) drug therapy: Secondary | ICD-10-CM | POA: Insufficient documentation

## 2014-01-07 DIAGNOSIS — R300 Dysuria: Secondary | ICD-10-CM | POA: Insufficient documentation

## 2014-01-07 DIAGNOSIS — Z8669 Personal history of other diseases of the nervous system and sense organs: Secondary | ICD-10-CM | POA: Insufficient documentation

## 2014-01-07 DIAGNOSIS — K5900 Constipation, unspecified: Secondary | ICD-10-CM | POA: Insufficient documentation

## 2014-01-07 DIAGNOSIS — N529 Male erectile dysfunction, unspecified: Secondary | ICD-10-CM | POA: Insufficient documentation

## 2014-01-07 DIAGNOSIS — K219 Gastro-esophageal reflux disease without esophagitis: Secondary | ICD-10-CM | POA: Insufficient documentation

## 2014-01-07 DIAGNOSIS — K623 Rectal prolapse: Secondary | ICD-10-CM

## 2014-01-07 LAB — BASIC METABOLIC PANEL
BUN: 12 mg/dL (ref 6–23)
CO2: 23 mEq/L (ref 19–32)
Calcium: 9.4 mg/dL (ref 8.4–10.5)
Chloride: 103 mEq/L (ref 96–112)
Creatinine, Ser: 0.77 mg/dL (ref 0.50–1.35)
GFR calc Af Amer: >90 mL/min (ref >90)
GFR calc non Af Amer: >90 mL/min (ref >90)
Glucose, Bld: 107 mg/dL — ABNORMAL HIGH (ref 70–99)
Potassium: 3.8 mEq/L (ref 3.7–5.3)
Sodium: 141 mEq/L (ref 137–147)

## 2014-01-07 LAB — CBC WITH DIFFERENTIAL/PLATELET
BASOS ABS: 0.1 10*3/uL (ref 0.0–0.1)
Basophils Relative: 1 % (ref 0–1)
EOS ABS: 0.1 10*3/uL (ref 0.0–0.7)
Eosinophils Relative: 1 % (ref 0–5)
HCT: 40.6 % (ref 39.0–52.0)
Hemoglobin: 13.6 g/dL (ref 13.0–17.0)
LYMPHS ABS: 1.3 10*3/uL (ref 0.7–4.0)
LYMPHS PCT: 16 % (ref 12–46)
MCH: 32.4 pg (ref 26.0–34.0)
MCHC: 33.5 g/dL (ref 30.0–36.0)
MCV: 96.7 fL (ref 78.0–100.0)
Monocytes Absolute: 0.7 10*3/uL (ref 0.1–1.0)
Monocytes Relative: 9 % (ref 3–12)
Neutro Abs: 6.1 10*3/uL (ref 1.7–7.7)
Neutrophils Relative %: 74 % (ref 43–77)
PLATELETS: 321 10*3/uL (ref 150–400)
RBC: 4.2 MIL/uL — AB (ref 4.22–5.81)
RDW: 13.3 % (ref 11.5–15.5)
WBC: 8.3 10*3/uL (ref 4.0–10.5)

## 2014-01-07 LAB — PROTIME-INR
INR: 2.17 — ABNORMAL HIGH (ref 0.00–1.49)
Prothrombin Time: 23.5 seconds — ABNORMAL HIGH (ref 11.6–15.2)

## 2014-01-07 MED ORDER — POLYETHYLENE GLYCOL 3350 17 G PO PACK: 17.0000 g | PACK | Freq: Every day | ORAL | Status: DC

## 2014-01-07 MED ORDER — WITCH HAZEL-GLYCERIN EX PADS: 1.0000 "application " | MEDICATED_PAD | CUTANEOUS | Status: DC | PRN

## 2014-01-07 MED ORDER — HYDROCORTISONE 2.5 % RE CREA: TOPICAL_CREAM | RECTAL | Status: DC

## 2014-01-07 NOTE — Telephone Encounter (Signed)
Noted. Pt currently being seen.

## 2014-01-07 NOTE — Progress Notes (Signed)
Subjective:    Patient ID: Ian Bowen, male    DOB: 08/06/1951, 63 y.o.   MRN: [id number]  HPI  Pt presents to the clinic today with c/o rectal pain. He reports this started Saturday but got much worse last night. He has an issue with hemorrhoids in the past. He feels like this is much worse. Normally, he is able to push the hemorrhoids back in. This time, there is so much rectal swelling, that he can not push them back in. Also, he has not had a BM since Friday and is not able to urinate hardly at all. He has not tried anything OTC.  Review of Systems  Past Medical History  Diagnosis Date  . Dyslipidemia   . S/P MVR (mitral valve replacement) [facility]    [facility] [place]  . Malfunction of mitral prosthetic valve     [place] 2006  . HTN (hypertension)   . PAF (paroxysmal atrial fibrillation)   . Mitral valve prolapse     s/p replacement  . GERD (gastroesophageal reflux disease)   . ED (erectile dysfunction)   . Cardiomyopathy   . OSA (obstructive sleep apnea)     Current Outpatient Prescriptions  Medication Sig Dispense Refill  . aspirin 81 MG tablet Take 81 mg by mouth daily.        . furosemide (LASIX) 20 MG tablet TAKE 1 TABLET DAILY  90 tablet  3  . lisinopril (PRINIVIL,ZESTRIL) 5 MG tablet Take 1 tablet (5 mg total) by mouth daily.  90 tablet  4  . lovastatin (MEVACOR) 20 MG tablet TAKE 1 TABLET AT BEDTIME  90 tablet  3  . metoprolol succinate (TOPROL-XL) 50 MG 24 hr tablet TAKE 1 AND 1/2 TABLETS TWO TIMES A DAY  270 tablet  2  . omeprazole (PRILOSEC) 10 MG capsule Take 10 mg by mouth daily.        . tadalafil (CIALIS) 5 MG tablet Take 1 tablet (5 mg total) by mouth daily as needed.  90 tablet  1  . warfarin (COUMADIN) 5 MG tablet TAKE AS DIRECTED BY        ANTICOAGULATION CLINIC  120 tablet  1   No current facility-administered medications for this visit.    No Known Allergies  Family History  Problem Relation Age of Onset  .  Heart attack Father     died of MI at age 62  . Coronary artery disease Father   . Cancer Mother 160    spleen cancer  . Coronary artery disease Paternal Uncle   . Coronary artery disease Paternal Grandfather   . Stroke Neg Hx   . Diabetes Neg Hx     History   Social History  . Marital Status: Single    Spouse Name: N/A    Number of Children: 2  . Years of Education: N/A   Occupational History  .  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 Smoking status: Never Smoker   . Smokeless tobacco: Never Used  . Alcohol Use: No     Comment: previously wine/beer (bottle wine nightly).  stopped with results of echo  . Drug Use: No  . Sexual Activity: Not on file   Other Topics Concern  . Not on file   Social History Narrative   Caffeine: 2 cups soda/day   Lives alone, no pets, s/p 2 divorces   Occupation: Regulatory affairs officerLead Hardware Planner for [employer]   Activity: stays active at  work, no regular exercise   Diet: some salads, trying to eat more healthy     Constitutional: Denies fever, malaise, fatigue, headache or abrupt weight changes.  Gastrointestinal: Pt reports rectal pain and constipation. Denies abdominal pain, bloating,  diarrhea or blood in the stool.    No other specific complaints in a complete review of systems (except as listed in HPI above).     Objective:   Physical Exam   BP 120/80  Pulse 97  Temp(Src) 98.3 F (36.8 C) (Tympanic)  Wt 191 lb (86.637 kg)  SpO2 98% Wt Readings from Last 3 Encounters:  01/07/14 191 lb (86.637 kg)  04/30/13 192 lb 9.6 oz (87.363 kg)  04/16/13 189 lb (85.73 kg)    General: Appears his stated age, very uncomfortable appearing, in NAD. Cardiovascular: Normal rate and rhythm. S1,S2 noted.  No murmur, rubs or gallops noted. No JVD or BLE edema. No carotid bruits noted. Pulmonary/Chest: Normal effort and positive vesicular breath sounds. No respiratory distress. No wheezes, rales or ronchi noted.  Abdomen: Soft and generally tender. Hypoactive  bowel sounds, no bruits noted. Distention noted. No masses noted. Liver, spleen and kidneys non palpable. Rectal: ? Rectal prolapse at the 1 oclock position. Also appears to have thrombosed external hemorroids.  BMET    Component Value Date/Time   NA 141 03/18/2013 0925   NA 141 03/31/2011   K 4.2 03/18/2013 0925   K 4.4 03/31/2011   CL 108 03/18/2013 0925   CO2 23 03/18/2013 0925   GLUCOSE 86 03/18/2013 0925   BUN 19 03/18/2013 0925   BUN 12 03/31/2011   CREATININE 1.0 03/18/2013 0925   CREATININE 0.88 03/31/2011   CALCIUM 9.2 03/18/2013 0925    Lipid Panel     Component Value Date/Time   CHOL 204* 03/18/2013 0925   TRIG 57.0 03/18/2013 0925   TRIG 104 03/31/2011   HDL 56.40 03/18/2013 0925   CHOLHDL 4 03/18/2013 0925   VLDL 11.4 03/18/2013 0925    CBC    Component Value Date/Time   WBC 4.4* 03/18/2013 0925   RBC 4.52 03/18/2013 0925   HGB 14.8 03/18/2013 0925   HCT 42.8 03/18/2013 0925   PLT 236.0 03/18/2013 0925   MCV 94.7 03/18/2013 0925   MCHC 34.6 03/18/2013 0925   RDW 14.5 03/18/2013 0925   LYMPHSABS 1.1 03/18/2013 0925   MONOABS 0.5 03/18/2013 0925   EOSABS 0.3 03/18/2013 0925   BASOSABS 0.1 03/18/2013 0925    Hgb A1C No results found for this basename: HGBA1C        Assessment & Plan:   Thrombosed external hemorrhoids with possible rectal prolapse:  [facility] to see if they could get him in today. They feel like he needs to be seen at the ER immediately Pt will drive himself [facility]  Will follow up after ER eval

## 2014-01-07 NOTE — Discharge Instructions (Signed)
Continue sitz baths. Apply ice several times a day. Use witch hazel pads. Start anusol HC cream. Start miralax or milk of mag daily. Please follow up with surgery office for recheck. Return if bleeding, worsening pain, difficulty urinating or having a bowel movement.    Hemorrhoids Hemorrhoids are swollen veins around the rectum or anus. There are two types of hemorrhoids:   Internal hemorrhoids. These occur in the veins just inside the rectum. They may poke through to the outside and become irritated and painful.  External hemorrhoids. These occur in the veins outside the anus and can be felt as a painful swelling or hard lump near the anus. CAUSES  Pregnancy.   Obesity.   Constipation or diarrhea.   Straining to have a bowel movement.   Sitting for long periods on the toilet.  Heavy lifting or other activity that caused you to strain.  Anal intercourse. SYMPTOMS   Pain.   Anal itching or irritation.   Rectal bleeding.   Fecal leakage.   Anal swelling.   One or more lumps around the anus.  DIAGNOSIS  Your caregiver may be able to diagnose hemorrhoids by visual examination. Other examinations or tests that may be performed include:   Examination of the rectal area with a gloved hand (digital rectal exam).   Examination of anal canal using a small tube (scope).   A blood test if you have lost a significant amount of blood.  A test to look inside the colon (sigmoidoscopy or colonoscopy). TREATMENT Most hemorrhoids can be treated at home. However, if symptoms do not seem to be getting better or if you have a lot of rectal bleeding, your caregiver may perform a procedure to help make the hemorrhoids get smaller or remove them completely. Possible treatments include:   Placing a rubber band at the base of the hemorrhoid to cut off the circulation (rubber band ligation).   Injecting a chemical to shrink the hemorrhoid (sclerotherapy).   Using a tool to  burn the hemorrhoid (infrared light therapy).   Surgically removing the hemorrhoid (hemorrhoidectomy).   Stapling the hemorrhoid to block blood flow to the tissue (hemorrhoid stapling).  HOME CARE INSTRUCTIONS   Eat foods with fiber, such as whole grains, beans, nuts, fruits, and vegetables. Ask your doctor about taking products with added fiber in them (fibersupplements).  Increase fluid intake. Drink enough water and fluids to keep your urine clear or pale yellow.   Exercise regularly.   Go to the bathroom when you have the urge to have a bowel movement. Do not wait.   Avoid straining to have bowel movements.   Keep the anal area dry and clean. Use wet toilet paper or moist towelettes after a bowel movement.   Medicated creams and suppositories may be used or applied as directed.   Only take over-the-counter or prescription medicines as directed by your caregiver.   Take warm sitz baths for 15 20 minutes, 3 4 times a day to ease pain and discomfort.   Place ice packs on the hemorrhoids if they are tender and swollen. Using ice packs between sitz baths may be helpful.   Put ice in a plastic bag.   Place a towel between your skin and the bag.   Leave the ice on for 15 20 minutes, 3 4 times a day.   Do not use a donut-shaped pillow or sit on the toilet for long periods. This increases blood pooling and pain.  SEEK MEDICAL CARE IF:  You have increasing pain and swelling that is not controlled by treatment or medicine.  You have uncontrolled bleeding.  You have difficulty or you are unable to have a bowel movement.  You have pain or inflammation outside the area of the hemorrhoids. MAKE SURE YOU:  Understand these instructions.  Will watch your condition.  Will get help right away if you are not doing well or get worse. Document Released: 09/30/2000 Document Revised: 09/19/2012 Document Reviewed: 08/07/2012 [facility] Patient Information 2014 [place],  [place].

## 2014-01-07 NOTE — Progress Notes (Signed)
Pre visit review using our clinic review tool, if applicable. No additional management support is needed unless otherwise documented below in the visit note. 

## 2014-01-07 NOTE — Telephone Encounter (Signed)
Called back to [facility] and spoke to G[place] the NP and told her that the patient will need to go to [facility] because he can not pee, alone with the other problems the patient is having, this was per Dr Dwain Sarna

## 2014-01-07 NOTE — Telephone Encounter (Signed)
Patient Information:  Caller Name: Geraldine  Phone: [phone]  Patient: Ian Bowen, Ian Bowen  Gender: Male  DOB: 1951-09-03  Age: 63 Years  PCP: Eustaquio [facility])  Office Follow Up:  Does the office need to follow up with this patient?: No  Instructions For The Office: N/A   Symptoms  Reason For Call & Symptoms: Has several large external hemorrhoids that are causing pain since 01/03/14. Normally he is able to retract them after having bowel movement, but has not been able to and he is starting to feel mild constipation has had some mild bleedng with passing small stools.. Last normal bm was on 01/03/14. Afebrile. He is also having some urinary sx of trouble starting stream- needing to standup to go. Hurts to sit.  Reviewed Health History In EMR: Yes  Reviewed Medications In EMR: Yes  Reviewed Allergies In EMR: Yes  Reviewed Surgeries / Procedures: Yes  Date of Onset of Symptoms: 01/03/2014  Treatments Tried: sitz baths daily, Prep H Cream  Treatments Tried Worked: No  Guideline(s) Used:  Rectal Symptoms  Disposition Per Guideline:   Go to Office Now  Reason For Disposition Reached:   Acute onset rectal pain and constipation (straining with rectal pressure or fullness), which is not relieved by Sitz bath or suppository  Advice Given:  Treatment of Acute Rectal Pain due to Fecal Impaction:   SITZ Bath - Take a 20-minute bath in warm water. Add 2 oz (57 grams) of baking soda to the bath water. This is also called a "Sitz bath" and it often helps relax the anal sphincter and release the bowel movement (BM).  Suppository - If the sitz bath does not work, try 1 or 2 glycerin rectal suppositories, which you can get over-the-counter (OTC) at your local pharmacy.  Treatment of Mild Rectal Pain:  Rectal pain and irritation can often be caused by either hemorrhoids or a tiny tear in the rectal opening (anal fissure). Small drops of blood can sometimes be seen on the toilet paper  or stool in people with hemorrhoids or rectal irritation from hard bowel movements.  Warm SITZ Bath Twice a Day - Sit in a warm saline bath for 20 minutes bid to cleanse the area and to promote healing. Add 2 ounces (57 grams) of table salt or baking soda to each tub of water. Afterwards, gently pat area dry with unscented toilet paper.  Topical Hydrocortisone Twice a Day - After taking a Sitz bath and drying your rectal area, apply 1% hydrocortisone ointment (OTC) bid to reduce irritation. Hydrocortisone is found in various OTC hemorrhoid medications (e.g., Anusol HC, Preparation H Hydrocortisone, Analpram HC Cream).  Call Back If:  Severe rectal pain or itching  Acute rectal pain due to fecal impaction is not completely relieved after treatment  Rectal pain or itching lasts over 3 days  Rectal bleeding (i.e., more than just a few drops on toilet paper from wiping)  You become worse.  Patient Will Follow Care Advice:  YES  Appointment Scheduled:  01/07/2014 14:00:00 Appointment Scheduled Provider:  Nicki Reaper

## 2014-01-07 NOTE — ED Notes (Signed)
Pt sent by Dr Jenne Pane for evaluation of hemorrhoids; pain since Friday

## 2014-01-07 NOTE — ED Provider Notes (Signed)
CSN: [id number]     Arrival date & time 01/07/14  1514 History   First MD Initiated Contact with Patient 01/07/14 1702     Chief Complaint  Patient presents with  . Hemorrhoids     (Consider location/radiation/quality/duration/timing/severity/associated sxs/prior Treatment) HPI Ian Bowen is a 63 y.o. male who presents to emergency department complaining of rectal painful mass. Patient states that he thinks it hemorrhoids. States that he has felt something come out of his rectum on and off for several months, states it usually goes back and after a bowel movement. States this time he felt it come out and it has been out for 4 days. It is painful. He is unable to have a bowel movement, and most recently unable to urinate and do to constipation. Patient went to his primary care doctor today, and was sent here after they spoke with central drill and a surgery for further evaluation. Patient denies any blood per rectum. He has tried preparation H and sitz baths with no relief of his symptoms. He denies any fever or chills. He denies any back pain. No other complaints.  Past Medical History  Diagnosis Date  . Dyslipidemia   . S/P MVR (mitral valve replacement) [facility] in [place]  . Malfunction of mitral prosthetic valve     Paravalvular leak repaired in [place]  . HTN (hypertension)   . PAF (paroxysmal atrial fibrillation)   . Mitral valve prolapse     s/p replacement  . GERD (gastroesophageal reflux disease)   . ED (erectile dysfunction)   . Cardiomyopathy   . OSA (obstructive sleep apnea)    Past Surgical History  Procedure Laterality Date  . Mitral valve replacement  2002    with MVR repair 2006 for leaking valve  . Cardioversion  2006  . Right knee surgery    . Left arthroscopic knee surgery    . 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 2d echo  07/12/2011    Normal LV size with severe global hypokinesis, EF 20%., mild pHTN, mechanical mitral  valve functioning normally  . Retinal surx      OD  . Colonoscopy  2011    per pt report normal   Family History  Problem Relation Age of Onset  . Heart attack Father     died of MI at age 56  . Coronary artery disease Father   . Cancer Mother 23    spleen cancer  . Coronary artery disease Paternal Uncle   . Coronary artery disease Paternal Grandfather   . Stroke Neg Hx   . Diabetes Neg Hx    History  Substance Use Topics  . Smoking status: Never Smoker   . Smokeless tobacco: Never Used  . Alcohol Use: No     Comment: previously wine/beer (bottle wine nightly).  stopped with results of echo    Review of Systems  Constitutional: Negative for fever and chills.  Respiratory: Negative for cough, chest tightness and shortness of breath.   Cardiovascular: Negative for chest pain, palpitations and leg swelling.  Gastrointestinal: Positive for constipation and rectal pain. Negative for nausea, vomiting, abdominal pain, diarrhea and abdominal distention.  Genitourinary: Positive for difficulty urinating. Negative for dysuria, urgency, frequency and hematuria.  Musculoskeletal: Negative for arthralgias, myalgias, neck pain and neck stiffness.  Skin: Negative for rash.  Allergic/Immunologic: Negative for immunocompromised state.  Neurological: Negative for dizziness, weakness, light-headedness, numbness and headaches.  Allergies  Review of patient's allergies indicates no known allergies.  Home Medications   Current Outpatient Rx  Name  Route  Sig  Dispense  Refill  . acetaminophen (TYLENOL) 500 MG tablet   Oral   Take 1,000 mg by mouth every 6 (six) hours as needed for moderate pain.         Marland Kitchen. aspirin 81 MG tablet   Oral   Take 81 mg by mouth daily.           . furosemide (LASIX) 20 MG tablet      TAKE 1 TABLET DAILY   90 tablet   3   . lisinopril (PRINIVIL,ZESTRIL) 5 MG tablet   Oral   Take 1 tablet (5 mg total) by mouth daily.   90 tablet   4   .  lovastatin (MEVACOR) 20 MG tablet      TAKE 1 TABLET AT BEDTIME   90 tablet   3   . metoprolol succinate (TOPROL-XL) 50 MG 24 hr tablet      TAKE 1 AND 1/2 TABLETS TWO TIMES A DAY         . omeprazole (PRILOSEC) 10 MG capsule   Oral   Take 10 mg by mouth daily.           . tadalafil (CIALIS) 5 MG tablet   Oral   Take 1 tablet (5 mg total) by mouth daily as needed.   90 tablet   1   . warfarin (COUMADIN) 5 MG tablet   Oral   Take 5-7.5 mg by mouth daily. Take 7.5 mg on Tuesday, Wednesday, Thursday, Saturday and Sunday. Take 5 mg on Monday and Friday.          BP 140/86  Pulse 92  Temp(Src) 98.7 F (37.1 C) (Oral)  Resp 16  SpO2 98% Physical Exam  Nursing note and vitals reviewed. Constitutional: He appears well-developed and well-nourished. No distress.  HENT:  Head: Normocephalic and atraumatic.  Eyes: Conjunctivae are normal.  Neck: Neck supple.  Cardiovascular: Normal rate, regular rhythm and normal heart sounds.   Pulmonary/Chest: Effort normal. No respiratory distress. He has no wheezes. He has no rales.  Abdominal: Soft. Bowel sounds are normal. He exhibits no distension. There is no tenderness. There is no rebound.  Genitourinary:  Large thrombosed hemorrhoids with swelling around the rectum. Tender to palpation  Musculoskeletal: He exhibits no edema.  Neurological: He is alert.  Skin: Skin is warm and dry.    ED Course  Procedures (including critical care time) Labs Review Labs Reviewed  CBC WITH DIFFERENTIAL - Abnormal; Notable for the following:    RBC 4.20 (*)    All other components within normal limits  BASIC METABOLIC PANEL - Abnormal; Notable for the following:    Glucose, Bld 107 (*)    All other components within normal limits  PROTIME-INR - Abnormal; Notable for the following:    Prothrombin Time 23.5 (*)    INR 2.17 (*)    All other components within normal limits   Imaging Review No results found.   EKG Interpretation None       MDM   Final diagnoses:  Thrombosed hemorrhoids    6:49 PM Spoke with RN in OR. Both Dr. Carolynne Edouardoth and Dr. Yong Channelesenbower in surgery and having an emergency, unable to answer at this time. Will call back. Pt informed, he is in no distress. Refused an ice pack.   7:24 PM Discussed with Dr. Abbey Chattersosenbower. Pt is  on coumadin, at this time not a surgical candidate. HOme with witch hazle pads, anusol cream, sitz baths, laxative. Follow up with him in the office.  Pt understands instructions and agreed to the plan.    Filed Vitals:   01/07/14 1537 01/07/14 1812 01/07/14 1936  BP: 140/86 126/93 124/70  Pulse: 92 68 66  Temp: 98.7 F (37.1 C)  98.4 F (36.9 C)  TempSrc: Oral    Resp: 16 20 18   SpO2: 98% 10% 100%      Lottie Mussel, PA-C 01/07/14 1948

## 2014-01-07 NOTE — Patient Instructions (Addendum)
Hemorrhoids Hemorrhoids are swollen veins around the rectum or anus. There are two types of hemorrhoids:   Internal hemorrhoids. These occur in the veins just inside the rectum. They may poke through to the outside and become irritated and painful.  External hemorrhoids. These occur in the veins outside the anus and can be felt as a painful swelling or hard lump near the anus. CAUSES  Pregnancy.   Obesity.   Constipation or diarrhea.   Straining to have a bowel movement.   Sitting for long periods on the toilet.  Heavy lifting or other activity that caused you to strain.  Anal intercourse. SYMPTOMS   Pain.   Anal itching or irritation.   Rectal bleeding.   Fecal leakage.   Anal swelling.   One or more lumps around the anus.  DIAGNOSIS  Your caregiver may be able to diagnose hemorrhoids by visual examination. Other examinations or tests that may be performed include:   Examination of the rectal area with a gloved hand (digital rectal exam).   Examination of anal canal using a small tube (scope).   A blood test if you have lost a significant amount of blood.  A test to look inside the colon (sigmoidoscopy or colonoscopy). TREATMENT Most hemorrhoids can be treated at home. However, if symptoms do not seem to be getting better or if you have a lot of rectal bleeding, your caregiver may perform a procedure to help make the hemorrhoids get smaller or remove them completely. Possible treatments include:   Placing a rubber band at the base of the hemorrhoid to cut off the circulation (rubber band ligation).   Injecting a chemical to shrink the hemorrhoid (sclerotherapy).   Using a tool to burn the hemorrhoid (infrared light therapy).   Surgically removing the hemorrhoid (hemorrhoidectomy).   Stapling the hemorrhoid to block blood flow to the tissue (hemorrhoid stapling).  HOME CARE INSTRUCTIONS   Eat foods with fiber, such as whole grains, beans,  nuts, fruits, and vegetables. Ask your doctor about taking products with added fiber in them (fibersupplements).  Increase fluid intake. Drink enough water and fluids to keep your urine clear or pale yellow.   Exercise regularly.   Go to the bathroom when you have the urge to have a bowel movement. Do not wait.   Avoid straining to have bowel movements.   Keep the anal area dry and clean. Use wet toilet paper or moist towelettes after a bowel movement.   Medicated creams and suppositories may be used or applied as directed.   Only take over-the-counter or prescription medicines as directed by your caregiver.   Take warm sitz baths for 15 20 minutes, 3 4 times a day to ease pain and discomfort.   Place ice packs on the hemorrhoids if they are tender and swollen. Using ice packs between sitz baths may be helpful.   Put ice in a plastic bag.   Place a towel between your skin and the bag.   Leave the ice on for 15 20 minutes, 3 4 times a day.   Do not use a donut-shaped pillow or sit on the toilet for long periods. This increases blood pooling and pain.  SEEK MEDICAL CARE IF:  You have increasing pain and swelling that is not controlled by treatment or medicine.  You have uncontrolled bleeding.  You have difficulty or you are unable to have a bowel movement.  You have pain or inflammation outside the area of the hemorrhoids. MAKE SURE YOU:    Understand these instructions.  Will watch your condition.  Will get help right away if you are not doing well or get worse. Document Released: 09/30/2000 Document Revised: 09/19/2012 Document Reviewed: 08/07/2012 ExitCare Patient Information 2014 [employer].  

## 2014-01-08 ENCOUNTER — Telehealth (INDEPENDENT_AMBULATORY_CARE_PROVIDER_SITE_OTHER): Payer: Self-pay | Admitting: *Deleted

## 2014-01-08 ENCOUNTER — Ambulatory Visit (INDEPENDENT_AMBULATORY_CARE_PROVIDER_SITE_OTHER): Payer: BC Managed Care – PPO | Admitting: Surgery

## 2014-01-08 ENCOUNTER — Encounter (INDEPENDENT_AMBULATORY_CARE_PROVIDER_SITE_OTHER): Payer: Self-pay | Admitting: Surgery

## 2014-01-08 VITALS — BP 133/80 | HR 77 | Temp 98.6°F | Resp 18 | Ht 70.0 in | Wt 192.2 lb

## 2014-01-08 DIAGNOSIS — K649 Unspecified hemorrhoids: Secondary | ICD-10-CM

## 2014-01-08 DIAGNOSIS — K648 Other hemorrhoids: Secondary | ICD-10-CM

## 2014-01-08 NOTE — Patient Instructions (Signed)
Hemorrhoids Hemorrhoids are swollen veins around the rectum or anus. There are two types of hemorrhoids:   Internal hemorrhoids. These occur in the veins just inside the rectum. They may poke through to the outside and become irritated and painful.  External hemorrhoids. These occur in the veins outside the anus and can be felt as a painful swelling or hard lump near the anus. CAUSES  Pregnancy.   Obesity.   Constipation or diarrhea.   Straining to have a bowel movement.   Sitting for long periods on the toilet.  Heavy lifting or other activity that caused you to strain.  Anal intercourse. SYMPTOMS   Pain.   Anal itching or irritation.   Rectal bleeding.   Fecal leakage.   Anal swelling.   One or more lumps around the anus.  DIAGNOSIS  Your caregiver may be able to diagnose hemorrhoids by visual examination. Other examinations or tests that may be performed include:   Examination of the rectal area with a gloved hand (digital rectal exam).   Examination of anal canal using a small tube (scope).   A blood test if you have lost a significant amount of blood.  A test to look inside the colon (sigmoidoscopy or colonoscopy). TREATMENT Most hemorrhoids can be treated at home. However, if symptoms do not seem to be getting better or if you have a lot of rectal bleeding, your caregiver may perform a procedure to help make the hemorrhoids get smaller or remove them completely. Possible treatments include:   Placing a rubber band at the base of the hemorrhoid to cut off the circulation (rubber band ligation).   Injecting a chemical to shrink the hemorrhoid (sclerotherapy).   Using a tool to burn the hemorrhoid (infrared light therapy).   Surgically removing the hemorrhoid (hemorrhoidectomy).   Stapling the hemorrhoid to block blood flow to the tissue (hemorrhoid stapling).  HOME CARE INSTRUCTIONS   Eat foods with fiber, such as whole grains, beans,  nuts, fruits, and vegetables. Ask your doctor about taking products with added fiber in them (fibersupplements).  Increase fluid intake. Drink enough water and fluids to keep your urine clear or pale yellow.   Exercise regularly.   Go to the bathroom when you have the urge to have a bowel movement. Do not wait.   Avoid straining to have bowel movements.   Keep the anal area dry and clean. Use wet toilet paper or moist towelettes after a bowel movement.   Medicated creams and suppositories may be used or applied as directed.   Only take over-the-counter or prescription medicines as directed by your caregiver.   Take warm sitz baths for 15 20 minutes, 3 4 times a day to ease pain and discomfort.   Place ice packs on the hemorrhoids if they are tender and swollen. Using ice packs between sitz baths may be helpful.   Put ice in a plastic bag.   Place a towel between your skin and the bag.   Leave the ice on for 15 20 minutes, 3 4 times a day.   Do not use a donut-shaped pillow or sit on the toilet for long periods. This increases blood pooling and pain.  SEEK MEDICAL CARE IF:  You have increasing pain and swelling that is not controlled by treatment or medicine.  You have uncontrolled bleeding.  You have difficulty or you are unable to have a bowel movement.  You have pain or inflammation outside the area of the hemorrhoids. MAKE SURE YOU:    Understand these instructions.  Will watch your condition.  Will get help right away if you are not doing well or get worse. Document Released: 09/30/2000 Document Revised: 09/19/2012 Document Reviewed: 08/07/2012 ExitCare Patient Information 2014 [employer].  

## 2014-01-08 NOTE — ED Provider Notes (Signed)
  This was a shared visit with a mid-level provided (NP or PA).  Throughout the patient's course I was available for consultation/collaboration.  I saw the relevant labs and studies - I agree with the interpretation.  On my exam the patient was in no distress.  He has a notable prolapsed area of rectal tissue.  With his INR that is elevated (but appropriate for him), he is not a candidate for emergent intervention.  After discussion with our surgical team he was d/c with 48 hr f/u.      Gerhard Munch, MD 01/08/14 [phone]

## 2014-01-08 NOTE — Progress Notes (Signed)
URGENT Office Ian Bowen 63 y.o.  Body mass index is 27.58 kg/(m^2).  Patient Active Problem List   Diagnosis Date Noted  . Prolapsed hemorrhoids 01/08/2014  . Encounter for therapeutic drug monitoring 11/26/2013  . Healthcare maintenance 03/21/2013  . OSA (obstructive sleep apnea)   . Right ankle injury 09/23/2011  . Cardiomyopathy 07/21/2011  . GERD (gastroesophageal reflux disease)   . CHF (congestive heart failure) 07/12/2011  . S/P mitral valve replacement 07/12/2011  . Atrial fibrillation 07/12/2011  . Hypertension 07/12/2011  . Hyperlipidemia 07/12/2011  . Encounter for long-term (current) use of anticoagulants 07/12/2011    No Known Allergies  Past Surgical History  Procedure Laterality Date  . Mitral valve replacement  2002    with MVR repair 2006 for leaking valve  . Cardioversion  2006  . Right knee surgery    . Left arthroscopic knee surgery    . 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 2d echo  07/12/2011    Normal LV size with severe global hypokinesis, EF 20%., mild pHTN, mechanical mitral valve functioning normally  . Retinal surx      OD  . Colonoscopy  2011    per pt report normal   Ian Boyden, MD No diagnosis found.  5 day history of prolapsed hemorrhoids that began when patient was out in [facility].  Normally he is able to get them reduced but he has been unable to .  There is a history of chronically prolapse.  Risks for hemorrhoidectomy includes valve replacement on warfarin.    Using 10 cc of lido/epi and neut, I was able to then reduce this right sided hemorrhoidal column prolapse.  We discussed chronic management strategies.  Will continue manually reducing these.  I don't think that banding with help his prolapse.  When he is ready, he will ultimately need hemorroidectomy.   [name], MD, [facility] [phone] beeper [phone]  01/08/2014 3:24 PM

## 2014-01-08 NOTE — Telephone Encounter (Signed)
Pt saw Dr. Daphine Deutscher today and states his Hemorrhoids have came back out.  I did reiterate Dr. Ermalene Searing note to him but he states that he cannot get them pushed back in.  He asked to speak with Dr. Daphine Deutscher and I advised Dr. Daphine Deutscher was in office and I would send him and his assistant a note.Please advise.Victorino Dike

## 2014-01-09 ENCOUNTER — Ambulatory Visit (INDEPENDENT_AMBULATORY_CARE_PROVIDER_SITE_OTHER): Payer: BC Managed Care – PPO | Admitting: General Surgery

## 2014-01-09 ENCOUNTER — Telehealth (INDEPENDENT_AMBULATORY_CARE_PROVIDER_SITE_OTHER): Payer: Self-pay | Admitting: *Deleted

## 2014-01-09 ENCOUNTER — Encounter (HOSPITAL_COMMUNITY): Payer: Self-pay | Admitting: *Deleted

## 2014-01-09 ENCOUNTER — Observation Stay (HOSPITAL_COMMUNITY)
Admission: AD | Admit: 2014-01-09 | Discharge: 2014-01-13 | Disposition: A | Discharge status: Home / Self Care | Payer: BC Managed Care – PPO | Source: Ambulatory Visit | Attending: General Surgery | Admitting: General Surgery

## 2014-01-09 ENCOUNTER — Encounter (INDEPENDENT_AMBULATORY_CARE_PROVIDER_SITE_OTHER): Payer: Self-pay | Admitting: General Surgery

## 2014-01-09 VITALS — BP 118/78 | HR 77 | Temp 98.7°F | Ht 70.0 in | Wt 187.0 lb

## 2014-01-09 DIAGNOSIS — K648 Other hemorrhoids: Secondary | ICD-10-CM

## 2014-01-09 DIAGNOSIS — K643 Fourth degree hemorrhoids: Secondary | ICD-10-CM

## 2014-01-09 DIAGNOSIS — Z7901 Long term (current) use of anticoagulants: Secondary | ICD-10-CM | POA: Insufficient documentation

## 2014-01-09 DIAGNOSIS — K644 Residual hemorrhoidal skin tags: Secondary | ICD-10-CM

## 2014-01-09 DIAGNOSIS — I10 Essential (primary) hypertension: Secondary | ICD-10-CM

## 2014-01-09 DIAGNOSIS — G4733 Obstructive sleep apnea (adult) (pediatric): Secondary | ICD-10-CM | POA: Insufficient documentation

## 2014-01-09 DIAGNOSIS — E785 Hyperlipidemia, unspecified: Secondary | ICD-10-CM | POA: Insufficient documentation

## 2014-01-09 DIAGNOSIS — K219 Gastro-esophageal reflux disease without esophagitis: Secondary | ICD-10-CM | POA: Insufficient documentation

## 2014-01-09 DIAGNOSIS — I509 Heart failure, unspecified: Secondary | ICD-10-CM | POA: Insufficient documentation

## 2014-01-09 DIAGNOSIS — K6289 Other specified diseases of anus and rectum: Secondary | ICD-10-CM | POA: Insufficient documentation

## 2014-01-09 DIAGNOSIS — Z954 Presence of other heart-valve replacement: Secondary | ICD-10-CM | POA: Insufficient documentation

## 2014-01-09 DIAGNOSIS — I4891 Unspecified atrial fibrillation: Secondary | ICD-10-CM | POA: Insufficient documentation

## 2014-01-09 LAB — CBC WITH DIFFERENTIAL/PLATELET
BASOS ABS: 0 10*3/uL (ref 0.0–0.1)
BASOS PCT: 1 % (ref 0–1)
Eosinophils Absolute: 0 10*3/uL (ref 0.0–0.7)
Eosinophils Relative: 0 % (ref 0–5)
HCT: 40.3 % (ref 39.0–52.0)
Hemoglobin: 13.7 g/dL (ref 13.0–17.0)
Lymphocytes Relative: 16 % (ref 12–46)
Lymphs Abs: 1.2 10*3/uL (ref 0.7–4.0)
MCH: 32.9 pg (ref 26.0–34.0)
MCHC: 34 g/dL (ref 30.0–36.0)
MCV: 96.6 fL (ref 78.0–100.0)
MONO ABS: 0.6 10*3/uL (ref 0.1–1.0)
Monocytes Relative: 8 % (ref 3–12)
NEUTROS ABS: 5.9 10*3/uL (ref 1.7–7.7)
Neutrophils Relative %: 76 % (ref 43–77)
PLATELETS: 261 10*3/uL (ref 150–400)
RBC: 4.17 MIL/uL — ABNORMAL LOW (ref 4.22–5.81)
RDW: 13 % (ref 11.5–15.5)
WBC: 7.8 10*3/uL (ref 4.0–10.5)

## 2014-01-09 LAB — BASIC METABOLIC PANEL
BUN: 13 mg/dL (ref 6–23)
CALCIUM: 9 mg/dL (ref 8.4–10.5)
CHLORIDE: 102 meq/L (ref 96–112)
CO2: 22 mEq/L (ref 19–32)
CREATININE: 0.9 mg/dL (ref 0.50–1.35)
GFR, EST NON AFRICAN AMERICAN: 89 mL/min — AB (ref >90)
Glucose, Bld: 73 mg/dL (ref 70–99)
Potassium: 4.3 mEq/L (ref 3.7–5.3)
Sodium: 139 mEq/L (ref 137–147)

## 2014-01-09 LAB — APTT: APTT: 43 s — AB (ref 24–37)

## 2014-01-09 LAB — PROTIME-INR
INR: 2.69 — ABNORMAL HIGH (ref 0.00–1.49)
Prothrombin Time: 27.7 seconds — ABNORMAL HIGH (ref 11.6–15.2)

## 2014-01-09 MED ORDER — POTASSIUM CHLORIDE IN NACL 20-0.9 MEQ/L-% IV SOLN
INTRAVENOUS | Status: DC
  Administered 2014-01-09 – 2014-01-12 (×4): via INTRAVENOUS
  Filled 2014-01-09 (×4): qty 1000

## 2014-01-09 MED ORDER — ENOXAPARIN SODIUM 40 MG/0.4ML ~~LOC~~ SOLN
40.0000 mg | SUBCUTANEOUS | Status: DC
Start: 2014-01-10 — End: 2014-01-09

## 2014-01-09 MED ORDER — ACETAMINOPHEN 325 MG PO TABS
650.0000 mg | ORAL_TABLET | Freq: Four times a day (QID) | ORAL | Status: DC | PRN
  Administered 2014-01-10 – 2014-01-12 (×2): 650 mg via ORAL
  Filled 2014-01-09 (×2): qty 2

## 2014-01-09 MED ORDER — DOCUSATE SODIUM 100 MG PO CAPS
100.0000 mg | ORAL_CAPSULE | Freq: Two times a day (BID) | ORAL | Status: DC
  Administered 2014-01-09 – 2014-01-12 (×6): 100 mg via ORAL
  Filled 2014-01-09 (×7): qty 1

## 2014-01-09 MED ORDER — ENOXAPARIN SODIUM 40 MG/0.4ML ~~LOC~~ SOLN
40.0000 mg | SUBCUTANEOUS | Status: DC
  Filled 2014-01-09: qty 0.4

## 2014-01-09 MED ORDER — ENOXAPARIN SODIUM 40 MG/0.4ML ~~LOC~~ SOLN
40.0000 mg | SUBCUTANEOUS | Status: DC
  Administered 2014-01-09 – 2014-01-10 (×2): 40 mg via SUBCUTANEOUS
  Filled 2014-01-09 (×3): qty 0.4

## 2014-01-09 MED ORDER — ACETAMINOPHEN 650 MG RE SUPP: 650.0000 mg | Freq: Four times a day (QID) | RECTAL | Status: DC | PRN

## 2014-01-09 MED ORDER — HYDROCORTISONE 1 % EX CREA
1.0000 "application " | TOPICAL_CREAM | Freq: Three times a day (TID) | CUTANEOUS | Status: DC
  Administered 2014-01-09 – 2014-01-12 (×8): 1 via TOPICAL
  Filled 2014-01-09: qty 28

## 2014-01-09 MED ORDER — ONDANSETRON HCL 4 MG/2ML IJ SOLN: 4.0000 mg | Freq: Four times a day (QID) | INTRAMUSCULAR | Status: DC | PRN

## 2014-01-09 MED ORDER — MORPHINE SULFATE 2 MG/ML IJ SOLN: 1.0000 mg | INTRAMUSCULAR | Status: DC | PRN

## 2014-01-09 NOTE — Progress Notes (Signed)
See H&P note.

## 2014-01-09 NOTE — Telephone Encounter (Signed)
Patient called to report that he is having more bleeding than yesterday and he is unable to manually reduce his hems.  Patient asking to be seen today to provide relief.  Appt made for urgent office this afternoon.

## 2014-01-09 NOTE — H&P (Signed)
Ian Bowen is an 63 y.o. male.   Chief Complaint: prolapsed internal hemorrhoids HPI: 63 year old Caucasian male with history of mitral by replacement on Coumadin who started having a problem with prolapsed hemorrhoids about a week ago. He normally has hemorrhoids that he'll be able to be manually reduced. However after going to [facility] on a business trip the hemorrhoids have not been able to be manually reduced. He's been having small bowel movements. He denies any fevers or chills. His hemorrhoids are now hurting. He also has had some difficulty urinating. He actually went to the emergency room the other night or no active intervention was done. He had not urinated for over a day. He was seen in urgent office yesterday when the hemorrhoids were injected and reduced and he was able to urinate afterwards. However a short time later the hemorrhoids became prolapsed again. He reports some bleeding. He also reports he hasn't taken his diuretic in 3 days because of fear of having to urinate. He does use CPAP. He doesn't drink a lot of water. He sits on the commode for 3-4 minutes at a time. He generally has a daily bowel movement. He denies any chest pain, shortness of breath, dyspnea on exertion, shortness of breath  Past Medical History  Diagnosis Date  . Dyslipidemia   . S/P MVR (mitral valve replacement) [facility] in [place]  . Malfunction of mitral prosthetic valve     Paravalvular leak repaired in [place]  . HTN (hypertension)   . PAF (paroxysmal atrial fibrillation)   . Mitral valve prolapse     s/p replacement  . GERD (gastroesophageal reflux disease)   . ED (erectile dysfunction)   . Cardiomyopathy   . OSA (obstructive sleep apnea)     Past Surgical History  Procedure Laterality Date  . Mitral valve replacement  2002    with MVR repair 2006 for leaking valve  . Cardioversion  2006  . Right knee surgery    . Left arthroscopic knee surgery    . Amputation finger /  thumb  1969    left ring finger  . 2d echo  07/12/2011    Normal LV size with severe global hypokinesis, EF 20%., mild pHTN, mechanical mitral valve functioning normally  . Retinal surx      OD  . Colonoscopy  2011    per pt report normal    Family History  Problem Relation Age of Onset  . Heart attack Father     died of MI at age 84  . Coronary artery disease Father   . Cancer Mother 81    spleen cancer  . Coronary artery disease Paternal Uncle   . Coronary artery disease Paternal Grandfather   . Stroke Neg Hx   . Diabetes Neg Hx    Social History:  reports that he has never smoked. He has never used smokeless tobacco. He reports that he does not drink alcohol or use illicit drugs.  Allergies: No Known Allergies   (Not in a hospital admission)  Results for orders placed during the hospital encounter of 01/07/14 (from the past 48 hour(s))  CBC WITH DIFFERENTIAL     Status: Abnormal   Collection Time    01/07/14  5:30 PM      Result Value Ref Range   WBC 8.3  4.0 - 10.5 K/uL   RBC 4.20 (*) 4.22 - 5.81 MIL/uL   Hemoglobin 13.6  13.0 - 17.0 g/dL   HCT  40.6  39.0 - 52.0 %   MCV 96.7  78.0 - 100.0 fL   MCH 32.4  26.0 - 34.0 pg   MCHC 33.5  30.0 - 36.0 g/dL   RDW 13.3  11.5 - 15.5 %   Platelets 321  150 - 400 K/uL   Neutrophils Relative % 74  43 - 77 %   Neutro Abs 6.1  1.7 - 7.7 K/uL   Lymphocytes Relative 16  12 - 46 %   Lymphs Abs 1.3  0.7 - 4.0 K/uL   Monocytes Relative 9  3 - 12 %   Monocytes Absolute 0.7  0.1 - 1.0 K/uL   Eosinophils Relative 1  0 - 5 %   Eosinophils Absolute 0.1  0.0 - 0.7 K/uL   Basophils Relative 1  0 - 1 %   Basophils Absolute 0.1  0.0 - 0.1 K/uL  BASIC METABOLIC PANEL     Status: Abnormal   Collection Time    01/07/14  5:30 PM      Result Value Ref Range   Sodium 141  137 - 147 mEq/L   Potassium 3.8  3.7 - 5.3 mEq/L   Chloride 103  96 - 112 mEq/L   CO2 23  19 - 32 mEq/L   Glucose, Bld 107 (*) 70 - 99 mg/dL   BUN 12  6 - 23 mg/dL    Creatinine, Ser 0.77  0.50 - 1.35 mg/dL   Calcium 9.4  8.4 - 10.5 mg/dL   GFR calc non Af Amer >90  >90 mL/min   GFR calc Af Amer >90  >90 mL/min   Comment: (NOTE)     The eGFR has been calculated using the CKD EPI equation.     This calculation has not been validated in all clinical situations.     eGFR's persistently <90 mL/min signify possible Chronic Kidney     Disease.  PROTIME-INR     Status: Abnormal   Collection Time    01/07/14  5:30 PM      Result Value Ref Range   Prothrombin Time 23.5 (*) 11.6 - 15.2 seconds   INR 2.17 (*) 0.00 - 1.49   No results found.  Review of Systems  Constitutional: Negative for weight loss.  HENT: Negative for nosebleeds.   Eyes: Negative for blurred vision.  Respiratory: Negative for shortness of breath.        +osa on cpap  Cardiovascular: Negative for chest pain, palpitations, orthopnea and PND.       Denies DOE; hasn't taken diuretic in 3 days b/c of fear not being able to void  Gastrointestinal: Negative for heartburn, nausea, vomiting and abdominal pain.  Genitourinary: Negative for dysuria and hematuria.       Some difficulty urinating.   Musculoskeletal: Negative.   Skin: Negative for itching and rash.  Neurological: Negative for dizziness, focal weakness, seizures, loss of consciousness and headaches.       Denies TIAs, amaurosis fugax  Endo/Heme/Allergies: Does not bruise/bleed easily.  Psychiatric/Behavioral: The patient is not nervous/anxious.     Blood pressure 118/78, pulse 77, temperature 98.7 F (37.1 C), temperature source Oral, height 5' 10"  (1.778 m), weight 187 lb (84.823 kg). Physical Exam  Vitals reviewed. Constitutional: He is oriented to person, place, and time. He appears well-developed and well-nourished. No distress.  HENT:  Head: Normocephalic and atraumatic.  Right Ear: External ear normal.  Left Ear: External ear normal.  Eyes: Conjunctivae are normal. No scleral icterus.  Neck:  Normal range of motion.  Neck supple. No tracheal deviation present. No thyromegaly present.  Cardiovascular: Normal rate.   Murmur (click) heard. Respiratory: Effort normal and breath sounds normal. No stridor. No respiratory distress. He has no wheezes.  GI: Soft. He exhibits no distension. There is no tenderness. There is no rebound and no guarding.  Musculoskeletal: He exhibits no edema and no tenderness.  Lymphadenopathy:    He has no cervical adenopathy.  Neurological: He is alert and oriented to person, place, and time. He exhibits normal muscle tone.  Skin: Skin is warm and dry. No rash noted. He is not diaphoretic. No erythema. No pallor.  Psychiatric: He has a normal mood and affect. His behavior is normal. Judgment and thought content normal.     Assessment/Plan Prolapsed ischemic internal hemorrhoids Enlarged external hemorrhoids Mechanical heart valve on coumadin OSA on CPAP HTN HPL GERD  Admit because of ischemic possible early necrotic hemorrhoids Check CBC, CBC and coags Hold Coumadin CPAP Bed rest, ice packs to hemorrhoids, stool softeners, sitz bath Possible hospitalist consult to help manage med issues Discussed with Dr Lawerance Cruel. Redmond Pulling, MD, FACS General, Bariatric, & Minimally Invasive Surgery [facility], [place]   [facility] M 01/09/2014, 3:46 PM

## 2014-01-10 LAB — PROTIME-INR
INR: 3.08 — ABNORMAL HIGH (ref 0.00–1.49)
PROTHROMBIN TIME: 30.7 s — AB (ref 11.6–15.2)

## 2014-01-10 LAB — APTT: aPTT: 47 seconds — ABNORMAL HIGH (ref 24–37)

## 2014-01-10 MED ORDER — WITCH HAZEL-GLYCERIN EX PADS
MEDICATED_PAD | CUTANEOUS | Status: DC | PRN
  Administered 2014-01-10: 1 via TOPICAL
  Filled 2014-01-10: qty 100

## 2014-01-10 MED ORDER — FUROSEMIDE 20 MG PO TABS
20.0000 mg | ORAL_TABLET | Freq: Every day | ORAL | Status: DC
  Administered 2014-01-10 – 2014-01-12 (×3): 20 mg via ORAL
  Filled 2014-01-10 (×3): qty 1

## 2014-01-10 MED ORDER — LISINOPRIL 5 MG PO TABS
5.0000 mg | ORAL_TABLET | Freq: Every day | ORAL | Status: DC
  Administered 2014-01-10 – 2014-01-12 (×2): 5 mg via ORAL
  Filled 2014-01-10 (×3): qty 1

## 2014-01-10 MED ORDER — PANTOPRAZOLE SODIUM 20 MG PO TBEC
20.0000 mg | DELAYED_RELEASE_TABLET | Freq: Every day | ORAL | Status: DC
  Administered 2014-01-10 – 2014-01-13 (×4): 20 mg via ORAL
  Filled 2014-01-10 (×4): qty 1

## 2014-01-10 MED ORDER — METOPROLOL SUCCINATE ER 50 MG PO TB24
50.0000 mg | ORAL_TABLET | Freq: Every day | ORAL | Status: DC
  Administered 2014-01-10 – 2014-01-12 (×3): 50 mg via ORAL
  Filled 2014-01-10 (×3): qty 1

## 2014-01-10 NOTE — Progress Notes (Signed)
Patient is alert and oriented and vital signs are stable. Patient has active bowel sounds but no bowel movement. Patient had warm Sitz bath and tolerated well. Patient also used ice pack and tucks pad to relieve itching. Patient is now on full liquid diet and tolerated well with no nausea or vomiting. Dose of metoprolol was followed up with MD. Venita Lick (student Nurse) 01/10/2014. 18:59

## 2014-01-10 NOTE — Progress Notes (Signed)
  Subjective: Complains of rectal pain and not being given any food  Objective: Vital signs in last 24 hours: Temp:  [97.5 F (36.4 C)-98.7 F (37.1 C)] 97.8 F (36.6 C) (03/27 1403) Pulse Rate:  [66-80] 76 (03/27 1403) Resp:  [16-20] 20 (03/27 1403) BP: (96-129)/(65-84) 110/70 mmHg (03/27 1403) SpO2:  [90 %-99 %] 90 % (03/27 1403) Weight:  [187 lb (84.823 kg)] 187 lb (84.823 kg) (03/26 1830) Last BM Date: 01/08/14  Intake/Output from previous day: 03/26 0701 - 03/27 0700 In: 457.7 [P.O.:1; I.V.:456.7] Out: 100 [Urine:100] Intake/Output this shift: Total I/O In: 400 [I.V.:400] Out: 650 [Urine:650]  Male genitalia: circumferential edematous hemorrhoids  Lab Results:   Recent Labs  01/07/14 1730 01/09/14 1851  WBC 8.3 7.8  HGB 13.6 13.7  HCT 40.6 40.3  PLT 321 261   BMET  Recent Labs  01/07/14 1730 01/09/14 1851  NA 141 139  K 3.8 4.3  CL 103 102  CO2 23 22  GLUCOSE 107* 73  BUN 12 13  CREATININE 0.77 0.90  CALCIUM 9.4 9.0   PT/INR  Recent Labs  01/09/14 1851 01/10/14 0415  LABPROT 27.7* 30.7*  INR 2.69* 3.08*   ABG No results found for this basename: PHART, PCO2, PO2, HCO3,  in the last 72 hours  Studies/Results: No results found.  Anti-infectives: Anti-infectives   None      Assessment/Plan: s/p * No surgery found * will apply ice packs and tucks pads. Restart home meds Allow fulls  LOS: 1 day    TOTH III,Taelon Bendorf S 01/10/2014

## 2014-01-11 ENCOUNTER — Observation Stay (HOSPITAL_COMMUNITY): Payer: BC Managed Care – PPO

## 2014-01-11 LAB — BASIC METABOLIC PANEL
BUN: 9 mg/dL (ref 6–23)
CALCIUM: 8.7 mg/dL (ref 8.4–10.5)
CO2: 26 meq/L (ref 19–32)
Chloride: 104 mEq/L (ref 96–112)
Creatinine, Ser: 0.81 mg/dL (ref 0.50–1.35)
GFR calc Af Amer: >90 mL/min (ref >90)
GFR calc non Af Amer: >90 mL/min (ref >90)
GLUCOSE: 83 mg/dL (ref 70–99)
Potassium: 4.1 mEq/L (ref 3.7–5.3)
Sodium: 140 mEq/L (ref 137–147)

## 2014-01-11 LAB — CBC
HCT: 40.8 % (ref 39.0–52.0)
Hemoglobin: 13.5 g/dL (ref 13.0–17.0)
MCH: 32.3 pg (ref 26.0–34.0)
MCHC: 33.1 g/dL (ref 30.0–36.0)
MCV: 97.6 fL (ref 78.0–100.0)
Platelets: 260 10*3/uL (ref 150–400)
RBC: 4.18 MIL/uL — AB (ref 4.22–5.81)
RDW: 13.3 % (ref 11.5–15.5)
WBC: 7.1 10*3/uL (ref 4.0–10.5)

## 2014-01-11 MED ORDER — LIDOCAINE 5 % EX OINT
TOPICAL_OINTMENT | Freq: Four times a day (QID) | CUTANEOUS | Status: DC | PRN
Start: 2014-01-11 — End: 2014-01-13
  Administered 2014-01-11: 15:00:00 via TOPICAL
  Administered 2014-01-12: 1 via TOPICAL
  Filled 2014-01-11: qty 35.44

## 2014-01-11 NOTE — Progress Notes (Signed)
Pt stated that he would self-administer CPAP when ready.  Pt requested for RT to increased max inspiratory pressure to 17 and decreased humidity to 3.  Pt to notify RT if any complications should arise.  RT to monitor and assess as needed.

## 2014-01-11 NOTE — Progress Notes (Signed)
  Subjective: Still with significant perianal pain, but improving  Objective: Vital signs in last 24 hours: Temp:  [97.7 F (36.5 C)-98.3 F (36.8 C)] 97.7 F (36.5 C) (03/28 0500) Pulse Rate:  [76-112] 112 (03/28 0500) Resp:  [17-20] 18 (03/28 0500) BP: (110-131)/(69-89) 116/77 mmHg (03/28 0500) SpO2:  [90 %-98 %] 98 % (03/28 0500) Last BM Date: 01/10/14  Intake/Output from previous day: 03/27 0701 - 03/28 0700 In: 1320 [P.O.:120; I.V.:1200] Out: 1250 [Urine:1250] Intake/Output this shift: Total I/O In: 240 [P.O.:240] Out: 450 [Urine:450]  2 large, edematous hemorrhoids with some ischemia, unable to completely reduce  Lab Results:   Recent Labs  01/09/14 1851 01/11/14 0445  WBC 7.8 7.1  HGB 13.7 13.5  HCT 40.3 40.8  PLT 261 260   BMET  Recent Labs  01/09/14 1851 01/11/14 0445  NA 139 140  K 4.3 4.1  CL 102 104  CO2 22 26  GLUCOSE 73 83  BUN 13 9  CREATININE 0.90 0.81  CALCIUM 9.0 8.7   PT/INR  Recent Labs  01/09/14 1851 01/10/14 0415  LABPROT 27.7* 30.7*  INR 2.69* 3.08*   ABG No results found for this basename: PHART, PCO2, PO2, HCO3,  in the last 72 hours  Studies/Results: Dg Abd 1 View  01/11/2014   CLINICAL DATA:  No abdominal pain.  History small bowel obstruction.  EXAM: ABDOMEN - 1 VIEW  COMPARISON:  None.  FINDINGS: Gas is demonstrated within nondilated loops of large and small bowel in a nonobstructed pattern. Supine evaluation limited for the detection of free intraperitoneal air. Stool is present in the cecum and ascending colon. Lung bases are clear lower lumbar spine degenerative change.  IMPRESSION: Nonobstructed bowel gas pattern.   Electronically Signed   By: Annia Belt M.D.   On: 01/11/2014 09:36    Anti-infectives: Anti-infectives   None      Assessment/Plan: s/p * No surgery found * Edematous external hemorrhoids  Given his coumadin, will continue conservative therapy  LOS: 2 days    Kazzandra Desaulniers  A 01/11/2014

## 2014-01-12 LAB — PROTIME-INR
INR: 2.11 — ABNORMAL HIGH (ref 0.00–1.49)
PROTHROMBIN TIME: 23 s — AB (ref 11.6–15.2)

## 2014-01-12 MED ORDER — METOPROLOL SUCCINATE ER 50 MG PO TB24
75.0000 mg | ORAL_TABLET | Freq: Two times a day (BID) | ORAL | Status: DC
  Administered 2014-01-12 – 2014-01-13 (×2): 75 mg via ORAL
  Filled 2014-01-12 (×3): qty 1

## 2014-01-12 MED ORDER — METOPROLOL SUCCINATE ER 50 MG PO TB24
75.0000 mg | ORAL_TABLET | Freq: Two times a day (BID) | ORAL | Status: DC
  Filled 2014-01-12 (×2): qty 1

## 2014-01-12 MED ORDER — FUROSEMIDE 20 MG PO TABS
20.0000 mg | ORAL_TABLET | Freq: Every day | ORAL | Status: DC
  Administered 2014-01-13: 20 mg via ORAL
  Filled 2014-01-12 (×2): qty 1

## 2014-01-12 MED ORDER — ALUM & MAG HYDROXIDE-SIMETH 200-200-20 MG/5ML PO SUSP: 30.0000 mL | Freq: Four times a day (QID) | ORAL | Status: DC | PRN

## 2014-01-12 MED ORDER — SIMVASTATIN 10 MG PO TABS
10.0000 mg | ORAL_TABLET | Freq: Every day | ORAL | Status: DC
  Administered 2014-01-12: 10 mg via ORAL
  Filled 2014-01-12 (×2): qty 1

## 2014-01-12 MED ORDER — SACCHAROMYCES BOULARDII 250 MG PO CAPS
250.0000 mg | ORAL_CAPSULE | Freq: Two times a day (BID) | ORAL | Status: DC
  Administered 2014-01-12 – 2014-01-13 (×3): 250 mg via ORAL
  Filled 2014-01-12 (×4): qty 1

## 2014-01-12 MED ORDER — DIPHENHYDRAMINE HCL 50 MG/ML IJ SOLN
12.5000 mg | Freq: Four times a day (QID) | INTRAMUSCULAR | Status: DC | PRN
Start: 2014-01-12 — End: 2014-01-13

## 2014-01-12 MED ORDER — LACTATED RINGERS IV BOLUS (SEPSIS): 1000.0000 mL | Freq: Three times a day (TID) | INTRAVENOUS | Status: DC | PRN

## 2014-01-12 MED ORDER — SODIUM CHLORIDE 0.9 % IJ SOLN: 3.0000 mL | INTRAMUSCULAR | Status: DC | PRN

## 2014-01-12 MED ORDER — HYDROCORTISONE ACE-PRAMOXINE 2.5-1 % RE CREA
1.0000 "application " | TOPICAL_CREAM | Freq: Four times a day (QID) | RECTAL | Status: DC | PRN
  Filled 2014-01-12: qty 30

## 2014-01-12 MED ORDER — LISINOPRIL 5 MG PO TABS
5.0000 mg | ORAL_TABLET | Freq: Every day | ORAL | Status: DC
  Administered 2014-01-13: 5 mg via ORAL
  Filled 2014-01-12: qty 1

## 2014-01-12 MED ORDER — ACETAMINOPHEN 500 MG PO TABS
1000.0000 mg | ORAL_TABLET | Freq: Three times a day (TID) | ORAL | Status: DC
  Administered 2014-01-12 – 2014-01-13 (×3): 1000 mg via ORAL
  Filled 2014-01-12 (×5): qty 2

## 2014-01-12 MED ORDER — SODIUM CHLORIDE 0.9 % IJ SOLN
3.0000 mL | Freq: Two times a day (BID) | INTRAMUSCULAR | Status: DC
  Administered 2014-01-12 (×2): 3 mL via INTRAVENOUS

## 2014-01-12 MED ORDER — ONDANSETRON HCL 4 MG PO TABS: 4.0000 mg | ORAL_TABLET | Freq: Four times a day (QID) | ORAL | Status: DC | PRN

## 2014-01-12 MED ORDER — METOPROLOL TARTRATE 1 MG/ML IV SOLN
5.0000 mg | Freq: Four times a day (QID) | INTRAVENOUS | Status: DC | PRN
  Filled 2014-01-12: qty 5

## 2014-01-12 MED ORDER — HYDROCORTISONE ACE-PRAMOXINE 2.5-1 % RE CREA: 1.0000 "application " | TOPICAL_CREAM | Freq: Four times a day (QID) | RECTAL | Status: DC

## 2014-01-12 MED ORDER — LIP MEDEX EX OINT
1.0000 "application " | TOPICAL_OINTMENT | Freq: Two times a day (BID) | CUTANEOUS | Status: DC
  Administered 2014-01-12: 1 via TOPICAL
  Filled 2014-01-12: qty 7

## 2014-01-12 MED ORDER — DIBUCAINE 1 % RE OINT
TOPICAL_OINTMENT | Freq: Three times a day (TID) | RECTAL | Status: DC
  Administered 2014-01-12: 1 via RECTAL
  Administered 2014-01-12: 22:00:00 via RECTAL
  Administered 2014-01-13: 1 via RECTAL
  Filled 2014-01-12: qty 28

## 2014-01-12 MED ORDER — POLYETHYLENE GLYCOL 3350 17 G PO PACK
17.0000 g | PACK | Freq: Two times a day (BID) | ORAL | Status: DC
  Administered 2014-01-12 – 2014-01-13 (×3): 17 g via ORAL
  Filled 2014-01-12 (×4): qty 1

## 2014-01-12 MED ORDER — WITCH HAZEL-GLYCERIN EX PADS
1.0000 "application " | MEDICATED_PAD | CUTANEOUS | Status: DC | PRN
  Filled 2014-01-12: qty 100

## 2014-01-12 MED ORDER — MAGIC MOUTHWASH
15.0000 mL | Freq: Four times a day (QID) | ORAL | Status: DC | PRN
  Filled 2014-01-12: qty 15

## 2014-01-12 MED ORDER — ASPIRIN 81 MG PO CHEW
81.0000 mg | CHEWABLE_TABLET | Freq: Every day | ORAL | Status: DC
  Administered 2014-01-12: 81 mg via ORAL
  Filled 2014-01-12 (×2): qty 1

## 2014-01-12 MED ORDER — ENOXAPARIN SODIUM 40 MG/0.4ML ~~LOC~~ SOLN
40.0000 mg | Freq: Two times a day (BID) | SUBCUTANEOUS | Status: DC
  Administered 2014-01-12 (×2): 40 mg via SUBCUTANEOUS
  Filled 2014-01-12 (×4): qty 0.4

## 2014-01-12 MED ORDER — OXYCODONE HCL 5 MG PO TABS: 5.0000 mg | ORAL_TABLET | ORAL | Status: DC | PRN

## 2014-01-12 NOTE — Progress Notes (Signed)
Pt stated that he would self administer CPAP when ready.  RT to monitor and assess as needed.

## 2014-01-12 NOTE — Progress Notes (Signed)
  Subjective: He is feeling slightly less pain and is improved somewhat  Objective: Vital signs in last 24 hours: Temp:  [98 F (36.7 C)-98.5 F (36.9 C)] 98 F (36.7 C) (03/29 0537) Pulse Rate:  [66-90] 90 (03/29 0537) Resp:  [16-18] 16 (03/29 0537) BP: (92-134)/(64-83) 92/64 mmHg (03/29 0537) SpO2:  [98 %-100 %] 100 % (03/29 0537) Last BM Date: 01/10/14  Intake/Output from previous day: 03/28 0701 - 03/29 0700 In: 2027.5 [P.O.:840; I.V.:1187.5] Out: 4700 [Urine:4700] Intake/Output this shift:   External hemorrhoids remain edematous but slightly better  Lab Results:   Recent Labs  01/09/14 1851 01/11/14 0445  WBC 7.8 7.1  HGB 13.7 13.5  HCT 40.3 40.8  PLT 261 260   BMET  Recent Labs  01/09/14 1851 01/11/14 0445  NA 139 140  K 4.3 4.1  CL 102 104  CO2 22 26  GLUCOSE 73 83  BUN 13 9  CREATININE 0.90 0.81  CALCIUM 9.0 8.7   PT/INR  Recent Labs  01/09/14 1851 01/10/14 0415  LABPROT 27.7* 30.7*  INR 2.69* 3.08*   ABG No results found for this basename: PHART, PCO2, PO2, HCO3,  in the last 72 hours  Studies/Results: Dg Abd 1 View  01/11/2014   CLINICAL DATA:  No abdominal pain.  History small bowel obstruction.  EXAM: ABDOMEN - 1 VIEW  COMPARISON:  None.  FINDINGS: Gas is demonstrated within nondilated loops of large and small bowel in a nonobstructed pattern. Supine evaluation limited for the detection of free intraperitoneal air. Stool is present in the cecum and ascending colon. Lung bases are clear lower lumbar spine degenerative change.  IMPRESSION: Nonobstructed bowel gas pattern.   Electronically Signed   By: Annia Belt M.D.   On: 01/11/2014 09:36    Anti-infectives: Anti-infectives   None      Assessment/Plan: s/p * No surgery found *  Will check his INR Continuing pain control and conservative treatment  LOS: 3 days    Jaecion Dempster A 01/12/2014

## 2014-01-12 NOTE — Plan of Care (Signed)
Problem: Phase I Progression Outcomes Goal: Incision/dressings dry and intact Outcome: Not Applicable Date Met:  81/27/51 No surgical intervention at this point.

## 2014-01-12 NOTE — Progress Notes (Addendum)
Brief Pharmacy note: Warfarin PTA-held since 3/27 for ischemic, possible necrotic prolapsed hemorrhoids.  3/29: Continue to hold Warfarin, Lovenox 40mg  bid to begin today per Dr. Magnus Ivan, INR 2.11 with goal INR 2.5-3.5 for hx Afib/MVR. Plan discharge home 3/30, concern for sub-therapeutic INR at discharge and will require a few days of Warfarin to reach therapeutic INR once resumed. Consider Lovenox at discharge, and appt at [facility] for monitoring after discharge to confirm INR reaches therapeutic range.  Thank you, Otho Bellows PharmD, 11:53 AM

## 2014-01-13 DIAGNOSIS — D68318 Other hemorrhagic disorder due to intrinsic circulating anticoagulants, antibodies, or inhibitors: Secondary | ICD-10-CM

## 2014-01-13 LAB — PROTIME-INR
INR: 1.74 — ABNORMAL HIGH (ref 0.00–1.49)
Prothrombin Time: 19.8 seconds — ABNORMAL HIGH (ref 11.6–15.2)

## 2014-01-13 MED ORDER — ENOXAPARIN SODIUM 40 MG/0.4ML ~~LOC~~ SOLN
40.0000 mg | SUBCUTANEOUS | Status: DC
  Filled 2014-01-13: qty 0.4

## 2014-01-13 MED ORDER — WARFARIN SODIUM 10 MG PO TABS
10.0000 mg | ORAL_TABLET | ORAL | Status: AC
  Administered 2014-01-13: 10 mg via ORAL
  Filled 2014-01-13: qty 1

## 2014-01-13 MED ORDER — HYDROCORTISONE ACE-PRAMOXINE 2.5-1 % RE CREA: 1.0000 "application " | TOPICAL_CREAM | Freq: Four times a day (QID) | RECTAL | Status: AC | PRN

## 2014-01-13 MED ORDER — OXYCODONE HCL 5 MG PO TABS: 5.0000 mg | ORAL_TABLET | Freq: Four times a day (QID) | ORAL | Status: DC | PRN

## 2014-01-13 MED ORDER — ENOXAPARIN SODIUM 80 MG/0.8ML ~~LOC~~ SOLN
80.0000 mg | Freq: Two times a day (BID) | SUBCUTANEOUS | Status: DC
  Administered 2014-01-13: 80 mg via SUBCUTANEOUS
  Filled 2014-01-13 (×2): qty 0.8

## 2014-01-13 MED ORDER — WARFARIN - PHARMACIST DOSING INPATIENT: Freq: Every day | Status: DC

## 2014-01-13 MED ORDER — LIDOCAINE 5 % EX OINT: TOPICAL_OINTMENT | Freq: Four times a day (QID) | CUTANEOUS | Status: DC | PRN

## 2014-01-13 MED ORDER — ENOXAPARIN SODIUM 80 MG/0.8ML ~~LOC~~ SOLN: 80.0000 mg | Freq: Two times a day (BID) | SUBCUTANEOUS | Status: DC

## 2014-01-13 NOTE — Discharge Summary (Signed)
Physician Discharge Summary  Patient ID: Ian Bowen MRN: [id number] DOB/AGE: 62-Jun-1952 62 y.o.  Admit date: 01/09/2014 Discharge date: 01/13/2014  Admitting Diagnosis: Prolapsed ischemic internal hemorrhoids Enlarged external hemorrhoids Mechanical heart valve on coumadin  OSA on CPAP  HTN  HPL  GERD  Discharge Diagnosis Patient Active Problem List   Diagnosis Date Noted  . Prolapsed internal hemorrhoids, grade 4 01/09/2014  . Prolapsed hemorrhoids 01/08/2014  . Encounter for therapeutic drug monitoring 11/26/2013  . Healthcare maintenance 03/21/2013  . OSA (obstructive sleep apnea)   . Right ankle injury 09/23/2011  . Cardiomyopathy 07/21/2011  . GERD (gastroesophageal reflux disease)   . CHF (congestive heart failure) 07/12/2011  . S/P mitral valve replacement 07/12/2011  . Atrial fibrillation 07/12/2011  . Hypertension 07/12/2011  . Hyperlipidemia 07/12/2011  . Encounter for long-term (current) use of anticoagulants 07/12/2011    Consultants Clinical pharmacist (Dr. Electa Sniff)  Imaging: No results found.  Procedures None  Hospital Course:  63 year old Caucasian male with history of mitral valve replacement on Coumadin who started having a problem with prolapsed hemorrhoids about a week ago. He normally has hemorrhoids that he'll be able to be manually reduced. However after going to [facility] on a business trip the hemorrhoids have not been able to be manually reduced. He's been having small bowel movements. He denies any fevers or chills. His hemorrhoids are now hurting. He also has had some difficulty urinating. He actually went to the ED the other night or no active intervention was done. He had not urinated for over a day. He was seen in urgent office yesterday when the hemorrhoids were injected and reduced and he was able to urinate afterwards. However a short time later the hemorrhoids became prolapsed again. He reports some bleeding. He also reports he  hasn't taken his diuretic in 3 days because of fear of having to urinate. He does use CPAP. He doesn't drink a lot of water. He sits on the commode for 3-4 minutes at a time. He generally has a daily bowel movement. He denies any chest pain, shortness of breath, dyspnea on exertion, shortness of breath.  Patient was admitted to the floor and treated conservatively with pain control, sitz bath, ice packs, anusol, lidocaine ointment and a good bowel regimen.  Diet was advanced as tolerated.  His hemorrhoids have improved (minimal pain and no further bleeding) and are not longer in need of surgery.   On HD #5, the patient was voiding well, tolerating diet, ambulating well, pain well controlled, vital signs stable, and felt stable for discharge home.  Patient will follow up in our office in 2-3 weeks with Dr. Maisie Fus and knows to call with questions or concerns.  He will follow the below pharmacy recommendations for restart his coumadin and lovenox bridge:  1. Increase Lovenox to 80 mg SQ q12h starting now. Will give a paper script so the patient can fill at his desired location 2. Boost warfarin with 10 mg today (already given), 10mg  tomorrow, then resume usual dosage on Wednesday 01/15/14 (7.5 mg daily except 5 mg on Mondays and Fridays).  3. Follow-up in [facility] on Thursday 01/16/14 at 8:00 am (appointment has been made).       Medication List    STOP taking these medications       hydrocortisone 2.5 % rectal cream  Commonly known as:  ANUSOL-HC      TAKE these medications       acetaminophen 500 MG tablet  Commonly known  as:  TYLENOL  Take 1,000 mg by mouth every 6 (six) hours as needed for moderate pain.     aspirin 81 MG tablet  Take 81 mg by mouth at bedtime.     enoxaparin 80 MG/0.8ML injection  Commonly known as:  LOVENOX  Inject 0.8 mLs (80 mg total) into the skin every 12 (twelve) hours.     furosemide 20 MG tablet  Commonly known as:  LASIX  Take 20 mg by mouth  daily.     hydrocortisone-pramoxine 2.5-1 % rectal cream  Commonly known as:  ANALPRAM-HC  Place 1 application rectally every 6 (six) hours as needed for hemorrhoids or itching.     lidocaine 5 % ointment  Commonly known as:  XYLOCAINE  Apply topically 4 (four) times daily as needed (anal pain).     lisinopril 5 MG tablet  Commonly known as:  PRINIVIL,ZESTRIL  Take 1 tablet (5 mg total) by mouth daily.     lovastatin 20 MG tablet  Commonly known as:  MEVACOR  Take 20 mg by mouth every morning.     metoprolol succinate 50 MG 24 hr tablet  Commonly known as:  TOPROL-XL  Take 75 mg by mouth 2 (two) times daily. Take with or immediately following a meal.     omeprazole 10 MG capsule  Commonly known as:  PRILOSEC  Take 10 mg by mouth daily.     oxyCODONE 5 MG immediate release tablet  Commonly known as:  Oxy IR/ROXICODONE  Take 1-2 tablets (5-10 mg total) by mouth every 6 (six) hours as needed for moderate pain, severe pain or breakthrough pain.     polyethylene glycol packet  Commonly known as:  MIRALAX  Take 17 g by mouth daily.     tadalafil 5 MG tablet  Commonly known as:  CIALIS  Take 1 tablet (5 mg total) by mouth daily as needed.     warfarin 5 MG tablet  Commonly known as:  COUMADIN  Take 5-7.5 mg by mouth daily. Take 7.5 mg on Tuesday, Wednesday, Thursday, Saturday and Sunday. Take 5 mg on Monday and Friday.     witch hazel-glycerin pad  Commonly known as:  TUCKS  Apply 1 application topically as needed for itching.         Follow-up Information   Call Vanita PandaHOMAS, ALICIA C., MD. (For new patient evaluation)    Specialty:  General Surgery   Contact information:   [address] [place] [address] [phone]       Follow up with Eustaquio BoydenJavier Gutierrez, MD. (As needed)    Specialty:  [facility]   Contact information:   [address] [place] [address] [phone]       Signed: Candiss NorseMegan Dort, PA-C [facility] [phone]  01/13/2014, 12:54 PM

## 2014-01-13 NOTE — Progress Notes (Signed)
ANTICOAGULATION CONSULT NOTE   Pharmacy Consult for warfarin, Lovenox Indication: mechanical prosthetic MVR; atrial fibrillation  No Known Allergies  Patient Measurements: Height: 5\' 10"  (177.8 cm) Weight: 187 lb (84.823 kg) IBW/kg (Calculated) : 73   Vital Signs: Temp: 98.4 F (36.9 C) (03/30 0803) Temp src: Oral (03/30 0803) BP: 126/82 mmHg (03/30 0803) Pulse Rate: 83 (03/30 0803)  Labs:  Recent Labs  01/11/14 0445 01/12/14 0939 01/13/14 0415  HGB 13.5  --   --   HCT 40.8  --   --   PLT 260  --   --   LABPROT  --  23.0* 19.8*  INR  --  2.11* 1.74*  CREATININE 0.81  --   --     Estimated Creatinine Clearance: 97.6 ml/min (by C-G formula based on Cr of 0.81).   Medical History: Past Medical History  Diagnosis Date  . Dyslipidemia   . S/P MVR (mitral valve replacement) [facility] in [place]  . Malfunction of mitral prosthetic valve     Paravalvular leak repaired in [place]  . HTN (hypertension)   . PAF (paroxysmal atrial fibrillation)   . Mitral valve prolapse     s/p replacement  . GERD (gastroesophageal reflux disease)   . ED (erectile dysfunction)   . Cardiomyopathy   . OSA (obstructive sleep apnea)     Medications:  Scheduled:  . acetaminophen  1,000 mg Oral TID  . aspirin  81 mg Oral QHS  . dibucaine   Rectal TID  . enoxaparin (LOVENOX) injection  40 mg Subcutaneous NOW  . enoxaparin (LOVENOX) injection  40 mg Subcutaneous NOW  . enoxaparin (LOVENOX) injection  80 mg Subcutaneous Q12H  . furosemide  20 mg Oral Daily  . lip balm  1 application Topical BID  . lisinopril  5 mg Oral Daily  . metoprolol succinate  75 mg Oral BID  . pantoprazole  20 mg Oral Daily  . polyethylene glycol  17 g Oral BID  . saccharomyces boulardii  250 mg Oral BID  . simvastatin  10 mg Oral q1800  . sodium chloride  3 mL Intravenous Q12H  . warfarin  10 mg Oral NOW  . Warfarin - Pharmacist Dosing Inpatient   Does not apply q1800   Infusions:    PRN: alum & mag hydroxide-simeth, diphenhydrAMINE, hydrocortisone-pramoxine, lactated ringers, lidocaine, magic mouthwash, metoprolol, ondansetron, ondansetron, oxyCODONE, sodium chloride, witch hazel-glycerin   Assessment: 63 y/o M on chronic warfarin therapy for mechanical MVR and atrial fibrillation.  He is followed by the [facility] with target INR 2.5-3.5.   Patient has been off warfarin (last taken 3/26) in anticipation of possible hemorrhoid surgery.  However, the attending physician has determined that surgery can be deferred until a later date, so the updated plan is to resume anticoagulant therapy.  Pt is currently receiving Lovenox but at a prophylactic dosage (40 mg SQ q12h).  Concomitant chronic low-dose ASA therapy noted.  Reviewed Coumadin Clinic dosing records and spoke with Luan Moore, Clinic RN.   Patient's usual regimen is warfarin 7.5 mg daily except 5 mg on Mondays and Fridays.   His INR was 2.7 at his last clinic visit on 12/24/13.  Today's INR is 1.74 (subtherapeutic).  Goal of Therapy:  INR 2.5-3.5    Plan:  1. Increase Lovenox to 80 mg SQ q12h starting now.  (Patient asks that the Lovenox prescription be sent to [facility] in [place] on [address], Phone [phone]).  2. Boost warfarin with 10 mg now, 10mg  tomorrow, then resume usual dosage on Wednesday 01/15/14 (7.5 mg daily except 5 mg on Mondays and Fridays). 3. Follow-up in [place] [facility] on Thursday 01/16/14 at 8:00 am  (appointment has been made).  Reviewed the above plan with Ms. Laural BenesJohnson, the Coumadin Clinic RN, who is in agreement.   Reviewed in detail with patient, who expressed understanding and is willing to be discharged today.  Reviewed with Dr. Derrell LollingIngram, who indicates he plans to proceed with discharging patient today.  Elie Goodyandy Shakeya Kerkman, PharmD, BCPS Pager: [phone] 01/13/2014  10:26 AM

## 2014-01-13 NOTE — Discharge Summary (Signed)
I agree with the discharge summary. I agree with the outpatient plans for management of anticoagulation. I agree to the outpatient with plans for followup regarding management of hemorrhoids.   Angelia Mould. Derrell Lolling, M.D., [facility] General and Minimally invasive Surgery Breast and Colorectal Surgery Office:   [phone] Pager:   [phone]

## 2014-01-13 NOTE — Progress Notes (Signed)
ANTICOAGULATION CONSULT NOTE   Pharmacy Consult for warfarin, Lovenox Indication: mechanical prosthetic MVR; atrial fibrillation  No Known Allergies  Patient Measurements: Height: 5\' 10"  (177.8 cm) Weight: 187 lb (84.823 kg) IBW/kg (Calculated) : 73   Vital Signs: Temp: 98.4 F (36.9 C) (03/30 0803) Temp src: Oral (03/30 0803) BP: 126/82 mmHg (03/30 0803) Pulse Rate: 83 (03/30 0803)  Labs:  Recent Labs  01/11/14 0445 01/12/14 0939 01/13/14 0415  HGB 13.5  --   --   HCT 40.8  --   --   PLT 260  --   --   LABPROT  --  23.0* 19.8*  INR  --  2.11* 1.74*  CREATININE 0.81  --   --     Estimated Creatinine Clearance: 97.6 ml/min (by C-G formula based on Cr of 0.81).   Medical History: Past Medical History  Diagnosis Date  . Dyslipidemia   . S/P MVR (mitral valve replacement) [facility]    [facility] [place]  . Malfunction of mitral prosthetic valve     [place]  . HTN (hypertension)   . PAF (paroxysmal atrial fibrillation)   . Mitral valve prolapse     s/p replacement  . GERD (gastroesophageal reflux disease)   . ED (erectile dysfunction)   . Cardiomyopathy   . OSA (obstructive sleep apnea)     Medications:  Scheduled:  . acetaminophen  1,000 mg Oral TID  . aspirin  81 mg Oral QHS  . dibucaine   Rectal TID  . enoxaparin (LOVENOX) injection  40 mg Subcutaneous BID  . furosemide  20 mg Oral Daily  . lip balm  1 application Topical BID  . lisinopril  5 mg Oral Daily  . metoprolol succinate  75 mg Oral BID  . pantoprazole  20 mg Oral Daily  . polyethylene glycol  17 g Oral BID  . saccharomyces boulardii  250 mg Oral BID  . simvastatin  10 mg Oral q1800  . sodium chloride  3 mL Intravenous Q12H   Infusions:   PRN: alum & mag hydroxide-simeth, diphenhydrAMINE, hydrocortisone-pramoxine, lactated ringers, lidocaine, magic mouthwash, metoprolol, ondansetron, ondansetron, oxyCODONE, sodium chloride, witch  hazel-glycerin   Assessment: 63 y/o M on chronic warfarin therapy for mechanical MVR and atrial fibrillation.  He is followed by the [facility] with target INR 2.5-3.5.   Patient has been off warfarin (last taken 3/26) in anticipation of possible hemorrhoid surgery.  However, the attending physician has determined that surgery is not needed at present and the updated plan is to resume his anticoagulant therapy.  He is currently receiving Lovenox but at a prophylactic dosage (40 mg SQ q12h).  Concomitant chronic low-dose ASA therapy noted.  Reviewed Coumadin Clinic dosing records and spoke with Luan MooreErika Walker Johnson, Clinic RN.   Patient's usual regimen is warfarin 7.5 mg daily except 5 mg on Mondays and Fridays.   His INR was 2.7 at his last clinic visit on 12/24/13.  Today's INR is 1.74 (subtherapeutic).  Goal of Therapy:  INR 2.5-3.5    Plan:  1. Increase Lovenox to 80 mg SQ q12h starting now.  (Patient asks that the [facility]oven[facility] [place] [address], Phone [phone]). 2. Boost warfarin with 10 mg now, 10mg  tomorrow, then resume usual dosage on Wednesday 01/15/14 (7.5 mg daily except 5 mg on Mondays and Fridays). 3. Follow-up in [place] [facility] on Thursday 01/16/14 at 8:00 am  (appointment has been  made).  Reviewed the above plan with Ms. Laural Benes, the Coumadin Clinic RN, who is in agreement.   Reviewed in detail with patient, who expressed understanding and is willing to be discharged today.  Reviewed with Dr. Derrell Lolling, who indicates he plans to proceed with discharging patient today.  Elie Goody, PharmD, BCPS Pager: [phone] 01/13/2014  10:21 AM

## 2014-01-13 NOTE — Discharge Instructions (Addendum)
PHARMACY INSTRUCTIONS FOR RESUMING COUMADIN AND TAKING LOVENOX:  1. Increase Lovenox to 80 mg subcutaneous every 12 hours (twice a day) starting now. Will give a paper script so you can fill at your desired location  2. Boost warfarin with 10 mg today (already given), 10mg  tomorrow (01/14/14), then resume usual dosage on Wednesday 01/15/14 (7.5 mg daily except 5 mg on Mondays and Fridays).  3. Follow-up in [place] [facility] on Thursday 01/16/14 at 8:00 am (appointment has been made).   Hemorrhoids Hemorrhoids are swollen veins around the rectum or anus. There are two types of hemorrhoids:   Internal hemorrhoids. These occur in the veins just inside the rectum. They may poke through to the outside and become irritated and painful.  External hemorrhoids. These occur in the veins outside the anus and can be felt as a painful swelling or hard lump near the anus. CAUSES  Pregnancy.   Obesity.   Constipation or diarrhea.   Straining to have a bowel movement.   Sitting for long periods on the toilet.  Heavy lifting or other activity that caused you to strain.  Anal intercourse. SYMPTOMS   Pain.   Anal itching or irritation.   Rectal bleeding.   Fecal leakage.   Anal swelling.   One or more lumps around the anus.  DIAGNOSIS  Your caregiver may be able to diagnose hemorrhoids by visual examination. Other examinations or tests that may be performed include:   Examination of the rectal area with a gloved hand (digital rectal exam).   Examination of anal canal using a small tube (scope).   A blood test if you have lost a significant amount of blood.  A test to look inside the colon (sigmoidoscopy or colonoscopy). TREATMENT Most hemorrhoids can be treated at home. However, if symptoms do not seem to be getting better or if you have a lot of rectal bleeding, your caregiver may perform a procedure to help make the hemorrhoids get smaller or remove them completely.  Possible treatments include:  1. Placing a rubber band at the base of the hemorrhoid to cut off the circulation (rubber band ligation).  2. Injecting a chemical to shrink the hemorrhoid (sclerotherapy).  3. Using a tool to burn the hemorrhoid (infrared light therapy).  4. Surgically removing the hemorrhoid (hemorrhoidectomy).  5. Stapling the hemorrhoid to block blood flow to the tissue (hemorrhoid stapling).  HOME CARE INSTRUCTIONS   Eat foods with fiber, such as whole grains, beans, nuts, fruits, and vegetables. Ask your doctor about taking products with added fiber in them (fibersupplements).  Increase fluid intake. Drink enough water and fluids to keep your urine clear or pale yellow.   Exercise regularly.   Go to the bathroom when you have the urge to have a bowel movement. Do not wait.   Avoid straining to have bowel movements.   Keep the anal area dry and clean. Use wet toilet paper or moist towelettes after a bowel movement.   Medicated creams and suppositories may be used or applied as directed.   Only take over-the-counter or prescription medicines as directed by your caregiver.   Take warm sitz baths for 15 20 minutes, 3 4 times a day to ease pain and discomfort.   Place ice packs on the hemorrhoids if they are tender and swollen. Using ice packs between sitz baths may be helpful.   Put ice in a plastic bag.   Place a towel between your skin and the bag.   Leave the  ice on for 15 20 minutes, 3 4 times a day.   Do not use a donut-shaped pillow or sit on the toilet for long periods. This increases blood pooling and pain.  SEEK MEDICAL CARE IF:  You have increasing pain and swelling that is not controlled by treatment or medicine.  You have uncontrolled bleeding.  You have difficulty or you are unable to have a bowel movement.  You have pain or inflammation outside the area of the hemorrhoids. MAKE SURE YOU:  Understand these  instructions.  Will watch your condition.  Will get help right away if you are not doing well or get worse. Document Released: 09/30/2000 Document Revised: 09/19/2012 Document Reviewed: 08/07/2012 [facility] Patient Information 2014 [place].  Sitz Bath A sitz bath is a warm water bath taken in the sitting position that covers only the hips and buttocks. It may be used for either healing or hygiene purposes. Sitz baths are also used to relieve pain, itching, or muscle spasms. The water may contain medicine. Moist heat will help you heal and relax.  HOME CARE INSTRUCTIONS  Take 3 to 4 sitz baths a day. 6. Fill the bathtub half full with warm water. 7. Sit in the water and open the drain a little. 8. Turn on the warm water to keep the tub half full. Keep the water running constantly. 9. Soak in the water for 15 to 20 minutes. 10. After the sitz bath, pat the affected area dry first. SEEK MEDICAL CARE IF:  You get worse instead of better. Stop the sitz baths if you get worse. MAKE SURE YOU:  Understand these instructions.  Will watch your condition.  Will get help right away if you are not doing well or get worse. Document Released: 06/25/2004 Document Revised: 06/27/2012 Document Reviewed: 12/31/2010 [facility] Patient Information 2014 [place].

## 2014-01-13 NOTE — Progress Notes (Signed)
  Subjective: He is very concerned about his anticoagulation, and does not want to go home until that is stable. Currently on Lovenox 40 mg. BID,  and Coumadin being held. INR down to 1.74.   On the other hand, he's tolerating full liquid diet, is having bowel movements, and his hemorrhoids feel better. No bleeding from his hemorrhoids.    Objective: Vital signs in last 24 hours: Temp:  [97.9 F (36.6 C)-98.4 F (36.9 C)] 98.4 F (36.9 C) (03/30 0803) Pulse Rate:  [65-83] 83 (03/30 0803) Resp:  [16-18] 18 (03/30 0803) BP: (105-136)/(68-89) 126/82 mmHg (03/30 0803) SpO2:  [97 %-100 %] 97 % (03/30 0803) Last BM Date: 01/10/14  Intake/Output from previous day: 03/29 0701 - 03/30 0700 In: 1420.5 [P.O.:1080; I.V.:340.5] Out: 2650 [Urine:2650] Intake/Output this shift: Total I/O In: 480 [P.O.:480] Out: 500 [Urine:500]    EXAM: General appearance: alert. Appears well. Mild anxiety. Rectal: Circumferential edematous hemorrhoids. No infection or cellulitis. Minimal thrombosis. Minimal tenderness.  Lab Results:   Recent Labs  01/11/14 0445  WBC 7.1  HGB 13.5  HCT 40.8  PLT 260   BMET  Recent Labs  01/11/14 0445  NA 140  K 4.1  CL 104  CO2 26  GLUCOSE 83  BUN 9  CREATININE 0.81  CALCIUM 8.7   PT/INR  Recent Labs  01/12/14 0939 01/13/14 0415  LABPROT 23.0* 19.8*  INR 2.11* 1.74*   ABG No results found for this basename: PHART, PCO2, PO2, HCO3,  in the last 72 hours  Studies/Results: No results found.  Anti-infectives: Anti-infectives   None      Assessment/Plan:  Edematous circumferential hemorrhoids. There may be some thrombosis at deeper levels. No infection. This required hospitalization for control of pain. Pain is now fairly well controlled. It is now clear that he does not require acute surgical intervention. In fact, I have informed him that it is in his best interest to let the edema further resolve to allow an elective operation  with less sacrifice of anoderm and less chance of stenosis. He seems to understand this. Continue twice a day MiraLAX and stool softeners. Continue ice pack and sitz bath  Status post mitral valve replacement on Coumadin. I have discussed resumption of Coumadin therapy with Dr. Napoleon Form from pharmacy and with the patient. He will need to be bridged with Lovenox to safely get him back to an INR of 2.5-3 or so. Dr. Napoleon Form will contact [place] [facility] to formulate a safe plan with regard to his mitral valve replacement.  He may be discharged when plans are completed.   LOS: 4 days    Dyani Babel M 01/13/2014

## 2014-01-16 ENCOUNTER — Ambulatory Visit (INDEPENDENT_AMBULATORY_CARE_PROVIDER_SITE_OTHER): Payer: BC Managed Care – PPO | Admitting: Pharmacist Clinician (PhC)/ Clinical Pharmacy Specialist

## 2014-01-16 DIAGNOSIS — Z952 Presence of prosthetic heart valve: Secondary | ICD-10-CM

## 2014-01-16 DIAGNOSIS — Z5181 Encounter for therapeutic drug level monitoring: Secondary | ICD-10-CM

## 2014-01-16 DIAGNOSIS — Z7901 Long term (current) use of anticoagulants: Secondary | ICD-10-CM

## 2014-01-16 DIAGNOSIS — I4891 Unspecified atrial fibrillation: Secondary | ICD-10-CM

## 2014-01-16 DIAGNOSIS — Z954 Presence of other heart-valve replacement: Secondary | ICD-10-CM

## 2014-01-16 LAB — POCT INR: INR: 2.1

## 2014-01-29 ENCOUNTER — Encounter (INDEPENDENT_AMBULATORY_CARE_PROVIDER_SITE_OTHER): Payer: Self-pay | Admitting: General Surgery

## 2014-01-29 ENCOUNTER — Ambulatory Visit (INDEPENDENT_AMBULATORY_CARE_PROVIDER_SITE_OTHER): Payer: BC Managed Care – PPO | Admitting: General Surgery

## 2014-01-29 VITALS — BP 124/80 | HR 73 | Temp 97.6°F | Resp 16 | Ht 70.0 in | Wt 182.2 lb

## 2014-01-29 DIAGNOSIS — K648 Other hemorrhoids: Secondary | ICD-10-CM

## 2014-01-29 DIAGNOSIS — K642 Third degree hemorrhoids: Secondary | ICD-10-CM

## 2014-01-29 MED ORDER — HYDROCORTISONE ACETATE 25 MG RE SUPP: 25.0000 mg | Freq: Two times a day (BID) | RECTAL | Status: DC

## 2014-01-29 NOTE — Patient Instructions (Signed)

## 2014-01-29 NOTE — Progress Notes (Signed)
Ian Bowen is a 63 y.o. male who is here for a follow up visit regarding his prolapsed hemorrhoids.  He was recently hospitalized due to grade 4 non-strangulated bleeding internal hemorrhoids.  These resolved with stopping his coumadin and aggressive pain control.  He feels that this is worse while taking Cialis.  His main symptoms were pain, urinary retention and minimal bleeding.   He has tried OTC creams in the past with some success.  He is currently taking stool softeners but denies any problems with constipation.  His last colonoscopy was in 2011.  It was normal per pt.  He had a banding done then.   Past Medical History  Diagnosis Date  . Dyslipidemia   . S/P MVR (mitral valve replacement) [facility]    [facility] in [place]  . Malfunction of mitral prosthetic valve     [place]avalvular leak [place] in [place] 2006  . HTN (hypertension)   . PAF (paroxysmal atrial fibrillation)   . Mitral valve prolapse     s/p replacement  . GERD (gastroesophageal reflux disease)   . ED (erectile dysfunction)   . Cardiomyopathy   . OSA (obstructive sleep apnea)    Past Surgical History  Procedure Laterality Date  . Mitral valve replacement  2002    with MVR repair 2006 for leaking valve  . Cardioversion  2006  . Right knee surgery    . Left arthroscopic knee surgery    . 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 2d echo  07/12/2011    Normal LV size with severe global hypokinesis, EF 20%., mild pHTN, mechanical mitral valve functioning normally  . Retinal surx      OD  . Colonoscopy  2011    per pt report normal  . Cardiac valve replacement     Current outpatient prescriptions:acetaminophen (TYLENOL) 500 MG tablet, Take 1,000 mg by mouth every 6 (six) hours as needed for moderate pain., Disp: , Rfl: ;  aspirin 81 MG tablet, Take 81 mg by mouth at bedtime. , Disp: , Rfl: ;  enoxaparin (LOVENOX) 80 MG/0.8ML injection, Inject 0.8 mLs (80 mg total) into the skin every 12 (twelve)  hours., Disp: 14 Syringe, Rfl: 0;  furosemide (LASIX) 20 MG tablet, Take 20 mg by mouth daily., Disp: , Rfl:  lidocaine (XYLOCAINE) 5 % ointment, Apply topically 4 (four) times daily as needed (anal pain)., Disp: 35.44 g, Rfl: 0;  lisinopril (PRINIVIL,ZESTRIL) 5 MG tablet, Take 1 tablet (5 mg total) by mouth daily., Disp: 90 tablet, Rfl: 4;  lovastatin (MEVACOR) 20 MG tablet, Take 20 mg by mouth every morning., Disp: , Rfl:  metoprolol succinate (TOPROL-XL) 50 MG 24 hr tablet, Take 75 mg by mouth 2 (two) times daily. Take with or immediately following a meal., Disp: , Rfl: ;  omeprazole (PRILOSEC) 10 MG capsule, Take 10 mg by mouth daily.  , Disp: , Rfl: ;  oxyCODONE (OXY IR/ROXICODONE) 5 MG immediate release tablet, Take 1-2 tablets (5-10 mg total) by mouth every 6 (six) hours as needed for moderate pain, severe pain or breakthrough pain., Disp: 30 tablet, Rfl: 0 polyethylene glycol (MIRALAX) packet, Take 17 g by mouth daily., Disp: 14 each, Rfl: 0;  PROCTOSOL HC 2.5 % rectal cream, , Disp: , Rfl: ;  tadalafil (CIALIS) 5 MG tablet, Take 1 tablet (5 mg total) by mouth daily as needed., Disp: 90 tablet, Rfl: 1;  warfarin (COUMADIN) 5 MG tablet, Take 5-7.5 mg by mouth daily.  Take 7.5 mg on Tuesday, Wednesday, Thursday, Saturday and Sunday. Take 5 mg on Monday and Friday., Disp: , Rfl:  witch hazel-glycerin (TUCKS) pad, Apply 1 application topically as needed for itching., Disp: 40 each, Rfl: 12;  hydrocortisone (ANUSOL-HC) 25 MG suppository, Place 1 suppository (25 mg total) rectally 2 (two) times daily., Disp: 12 suppository, Rfl: 2 No Known Allergies Review of Systems - General ROS: negative for - chills or fever Respiratory ROS: no cough, shortness of breath, or wheezing Cardiovascular ROS: no chest pain or dyspnea on exertion Gastrointestinal ROS: no abdominal pain, change in bowel habits, or black or bloody stools Genito-Urinary ROS: no dysuria, trouble voiding, or hematuria   Objective: Filed  Vitals:   01/29/14 1028  BP: 124/80  Pulse: 73  Temp: 97.6 F (36.4 C)  Resp: 16    General appearance: alert and cooperative Resp: clear to auscultation bilaterally Cardio: regular rate and rhythm GI: normal findings: soft, non-tender  Procedure: Anoscopy Surgeon: Ian Bowen Assistant: Ian Bowen R After the risks and benefits were explained, verbal consent was obtained for above procedure  Anesthesia: none Diagnosis: hemrrhoids Findings:Grade 2 left lateral and right anterior hemorrhoid grade 3 right posterior hemorrhoid with signs of inflammation and recent prolapse. There was some mild thrombosis of the smaller hemorrhoids noted that was nontender to palpation. No external hemorrhoid disease noted.  Assessment and Plan: Ian Bowen This 63 year old male who was recently hospitalized for prolapsed internal hemorrhoids. This resolved with medical treatment. He is here today to discuss long-term management of his hemorrhoids. He is having regular bowel movements and denies any problems with constipation or diarrhea. On exam he has a prolapsed grade 3 hemorrhoid that appears to be the source of most of his problems given the inflammation noted. We discussed medical management with fiber and steroid suppositories versus sigmoidopexy versus hemorrhoidectomy. We have decided to proceed with hemorrhoidal pexy. I think I can do this without any major bleeding on his Coumadin, given that there is no resection performed. We discussed that there may be some mild bleeding but that there should not be any major bleeding. He has agreed to proceed with hemorrhoidal pexy. We will schedule this at his convenience. I have given him a suppository to use when he has symptoms on the road traveling.    Vanita Panda, MD [facility], [place] [phone]

## 2014-02-04 ENCOUNTER — Ambulatory Visit (INDEPENDENT_AMBULATORY_CARE_PROVIDER_SITE_OTHER): Payer: BC Managed Care – PPO | Admitting: *Deleted

## 2014-02-04 ENCOUNTER — Telehealth (INDEPENDENT_AMBULATORY_CARE_PROVIDER_SITE_OTHER): Payer: Self-pay

## 2014-02-04 DIAGNOSIS — Z5181 Encounter for therapeutic drug level monitoring: Secondary | ICD-10-CM

## 2014-02-04 DIAGNOSIS — Z952 Presence of prosthetic heart valve: Secondary | ICD-10-CM

## 2014-02-04 DIAGNOSIS — Z7901 Long term (current) use of anticoagulants: Secondary | ICD-10-CM

## 2014-02-04 DIAGNOSIS — Z954 Presence of other heart-valve replacement: Secondary | ICD-10-CM

## 2014-02-04 DIAGNOSIS — I4891 Unspecified atrial fibrillation: Secondary | ICD-10-CM

## 2014-02-04 LAB — POCT INR: INR: 3.9

## 2014-02-04 NOTE — Telephone Encounter (Signed)
[facility] Lelon Perla RN states patient  INR was high this am, he told her he does drink alcohol occasionally, Jasmine December will recheck PT/INR before sx  02/19/14

## 2014-02-05 NOTE — Telephone Encounter (Signed)
Sounds good, thanks.  Pleas let me know what the follow up results are.

## 2014-02-06 ENCOUNTER — Other Ambulatory Visit: Payer: Self-pay | Admitting: Cardiology

## 2014-02-11 ENCOUNTER — Telehealth: Payer: Self-pay | Admitting: *Deleted

## 2014-02-11 NOTE — Telephone Encounter (Signed)
PA for cialis done and faxed to [facility]

## 2014-02-17 ENCOUNTER — Encounter (HOSPITAL_BASED_OUTPATIENT_CLINIC_OR_DEPARTMENT_OTHER): Payer: Self-pay | Admitting: *Deleted

## 2014-02-17 NOTE — Progress Notes (Signed)
PER PT STATES  HEMORRHOIDS ARE BETTER AND WOULD LIKE TO POSTPONE PROCEDURE MAINLY BECAUSE  TIME NEEDED TO TAKE OFF WORK.  PT STATES WILL CALL OFFICE TO SPEAK WITH DR Maisie Fus.

## 2014-02-19 ENCOUNTER — Ambulatory Visit (INDEPENDENT_AMBULATORY_CARE_PROVIDER_SITE_OTHER): Payer: BC Managed Care – PPO | Admitting: *Deleted

## 2014-02-19 ENCOUNTER — Encounter (HOSPITAL_BASED_OUTPATIENT_CLINIC_OR_DEPARTMENT_OTHER): Admission: RE | Payer: Self-pay | Source: Ambulatory Visit

## 2014-02-19 ENCOUNTER — Ambulatory Visit (HOSPITAL_BASED_OUTPATIENT_CLINIC_OR_DEPARTMENT_OTHER): Admission: RE | Admit: 2014-02-19 | Payer: BC Managed Care – PPO | Source: Ambulatory Visit | Admitting: General Surgery

## 2014-02-19 DIAGNOSIS — Z5181 Encounter for therapeutic drug level monitoring: Secondary | ICD-10-CM

## 2014-02-19 DIAGNOSIS — Z954 Presence of other heart-valve replacement: Secondary | ICD-10-CM

## 2014-02-19 DIAGNOSIS — Z7901 Long term (current) use of anticoagulants: Secondary | ICD-10-CM

## 2014-02-19 DIAGNOSIS — Z952 Presence of prosthetic heart valve: Secondary | ICD-10-CM

## 2014-02-19 DIAGNOSIS — I4891 Unspecified atrial fibrillation: Secondary | ICD-10-CM

## 2014-02-19 HISTORY — DX: Hyperlipidemia, unspecified: E78.5

## 2014-02-19 HISTORY — DX: Heart failure, unspecified: I50.9

## 2014-02-19 HISTORY — DX: Obstructive sleep apnea (adult) (pediatric): G47.33

## 2014-02-19 HISTORY — DX: Dependence on other enabling machines and devices: Z99.89

## 2014-02-19 HISTORY — DX: Third degree hemorrhoids: K64.2

## 2014-02-19 HISTORY — DX: Permanent atrial fibrillation: I48.21

## 2014-02-19 LAB — POCT INR: INR: 2.5

## 2014-02-19 SURGERY — HEMORRHOIDECTOMY
Anesthesia: Choice

## 2014-03-01 ENCOUNTER — Other Ambulatory Visit: Payer: Self-pay | Admitting: Family Medicine

## 2014-03-13 ENCOUNTER — Ambulatory Visit (INDEPENDENT_AMBULATORY_CARE_PROVIDER_SITE_OTHER): Payer: BC Managed Care – PPO | Admitting: *Deleted

## 2014-03-13 DIAGNOSIS — Z7901 Long term (current) use of anticoagulants: Secondary | ICD-10-CM

## 2014-03-13 DIAGNOSIS — Z952 Presence of prosthetic heart valve: Secondary | ICD-10-CM

## 2014-03-13 DIAGNOSIS — Z954 Presence of other heart-valve replacement: Secondary | ICD-10-CM

## 2014-03-13 DIAGNOSIS — I4891 Unspecified atrial fibrillation: Secondary | ICD-10-CM

## 2014-03-13 DIAGNOSIS — Z5181 Encounter for therapeutic drug level monitoring: Secondary | ICD-10-CM

## 2014-03-13 LAB — POCT INR: INR: 1.4

## 2014-03-14 ENCOUNTER — Encounter (INDEPENDENT_AMBULATORY_CARE_PROVIDER_SITE_OTHER): Payer: BC Managed Care – PPO | Admitting: General Surgery

## 2014-03-20 ENCOUNTER — Ambulatory Visit (INDEPENDENT_AMBULATORY_CARE_PROVIDER_SITE_OTHER): Payer: BC Managed Care – PPO | Admitting: *Deleted

## 2014-03-20 ENCOUNTER — Encounter: Payer: Self-pay | Admitting: Cardiology

## 2014-03-20 ENCOUNTER — Ambulatory Visit (INDEPENDENT_AMBULATORY_CARE_PROVIDER_SITE_OTHER): Payer: BC Managed Care – PPO | Admitting: Cardiology

## 2014-03-20 VITALS — BP 125/79 | HR 115 | Ht 70.0 in | Wt 184.2 lb

## 2014-03-20 DIAGNOSIS — Z954 Presence of other heart-valve replacement: Secondary | ICD-10-CM

## 2014-03-20 DIAGNOSIS — Z7901 Long term (current) use of anticoagulants: Secondary | ICD-10-CM

## 2014-03-20 DIAGNOSIS — I428 Other cardiomyopathies: Secondary | ICD-10-CM

## 2014-03-20 DIAGNOSIS — E785 Hyperlipidemia, unspecified: Secondary | ICD-10-CM

## 2014-03-20 DIAGNOSIS — I429 Cardiomyopathy, unspecified: Secondary | ICD-10-CM

## 2014-03-20 DIAGNOSIS — Z952 Presence of prosthetic heart valve: Secondary | ICD-10-CM

## 2014-03-20 DIAGNOSIS — Z5181 Encounter for therapeutic drug level monitoring: Secondary | ICD-10-CM

## 2014-03-20 DIAGNOSIS — I509 Heart failure, unspecified: Secondary | ICD-10-CM

## 2014-03-20 DIAGNOSIS — I4891 Unspecified atrial fibrillation: Secondary | ICD-10-CM

## 2014-03-20 LAB — POCT INR: INR: 1.9

## 2014-03-20 MED ORDER — LISINOPRIL 5 MG PO TABS: 5.0000 mg | ORAL_TABLET | Freq: Every day | ORAL | Status: DC

## 2014-03-20 MED ORDER — LOVASTATIN 20 MG PO TABS: 20.0000 mg | ORAL_TABLET | ORAL | Status: DC

## 2014-03-20 MED ORDER — METOPROLOL SUCCINATE ER 100 MG PO TB24
100.0000 mg | ORAL_TABLET | Freq: Two times a day (BID) | ORAL | Status: DC
Start: 2014-03-20 — End: 2015-02-17

## 2014-03-20 MED ORDER — FUROSEMIDE 20 MG PO TABS: 20.0000 mg | ORAL_TABLET | Freq: Every day | ORAL | Status: DC

## 2014-03-20 NOTE — Progress Notes (Signed)
HPI: FU cardiomyopathy and atrial fibrilllation. Patient underwent mitral valve replacement with a [employer] bileaflet valve in September of 2002. Note preoperative cardiac catheterization showed normal coronary arteries and normal LV function. He apparently had subsequent repair in 2006 due to paravalvular leak. Transesophageal echocardiogram in May 2009 showed normally functioning mechanical mitral valve with no leak. LV function was normal. Patient also has a history of paroxysmal atrial fibrillation. He was seen at [facility] in September of 2012 with an episode of atrial fibrillation. Cardizem was added to his medical regimen as was Lasix and potassium. A Myoview was performed in September of 2012 and showed inferior apical infarct with minimal reversibility. Ejection fraction 18%. Echocardiogram in September of 2012 showed an ejection fraction of 20%, diffuse hypokinesis, normally functioning prosthetic mitral valve, biatrial enlargement, right ventricular enlargement with decreased function. We felt his cardiomyopathy may be tachycardia mediated vs ETOH. Holter in Oct 2012 showed his rate mildly increased and we increased his toprol. Echo repeated in July 2014 and showed EF 50-55. Prosthetic MV with mean gradient 9 mmHg, biatrial enlargement, oscillating density in LV cavity most likely related to residual MV apparatus. Since I last saw him in July of 2014, the patient denies any dyspnea on exertion, orthopnea, PND, pedal edema, palpitations, syncope or chest pain.   Current Outpatient Prescriptions  Medication Sig Dispense Refill  . acetaminophen (TYLENOL) 500 MG tablet Take 1,000 mg by mouth every 6 (six) hours as needed for moderate pain.      Marland Kitchen aspirin 81 MG tablet Take 81 mg by mouth at bedtime.       . furosemide (LASIX) 20 MG tablet TAKE 1 TABLET (20 MG TOTAL)DAILY  90 tablet  3  . lisinopril (PRINIVIL,ZESTRIL) 5 MG tablet Take 1 tablet (5 mg total) by mouth daily.  90 tablet   4  . lovastatin (MEVACOR) 20 MG tablet Take 20 mg by mouth every morning.      . metoprolol succinate (TOPROL-XL) 50 MG 24 hr tablet Take 75 mg by mouth 2 (two) times daily. Take with or immediately following a meal.      . omeprazole (PRILOSEC) 10 MG capsule Take 10 mg by mouth daily.        . polyethylene glycol (MIRALAX) packet Take 17 g by mouth daily.  14 each  0  . tadalafil (CIALIS) 5 MG tablet Take this medication as directed by your pharmacy  90 tablet  1  . warfarin (COUMADIN) 5 MG tablet Take 5-7.5 mg by mouth daily. Take 7.5 mg on Tuesday, Wednesday, Thursday, Saturday and Sunday. Take 5 mg on Monday and Friday.       No current facility-administered medications for this visit.     Past Medical History  Diagnosis Date  . S/P MVR (mitral valve replacement)     SEPT  2002   IN   [place]  . HTN (hypertension)   . GERD (gastroesophageal reflux disease)   . ED (erectile dysfunction)   . Cardiomyopathy     per echo  05-07-2013   ef  50%  . Atrial fibrillation, permanent   . Hyperlipidemia   . CHF (congestive heart failure)     monitored by dr Jens Som--  euvolemic  . OSA on CPAP     moderate OSA   PER STUDY  09-02-2012  W/ DR CLANCE  . Prolapsed internal hemorrhoids, grade 3     Past Surgical History  Procedure Laterality Date  . Mitral valve replacement  SEPT  2002   ([facility] [place])    [employer] bileaflet percardial valve  . Cardioversion  2006  . Right knee surgery    . 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 Retinal surx      OD  . Colonoscopy  2011    per pt report normal  . Mitral valve repair  2006   ( in [place])    secondary to paravalvular leak  . Knee arthroscopy Left   . Cardiovascular stress test  07-15-2011   DR CRENSHAW    SEVERE LV SYSTOLIC DYSFUNCTION WITH INFEROAPICAL SCAR WITH MINIMAL REVERSIBILITY/   EF 18%  . Transthoracic echocardiogram  05-07-2013    DR CRENSHAW    MILD LVH/  EF  50%/  OSCILLATING DENSITY IN  LV CAVITY MOST LIKELY RESIDUAL MV APPARATUS FROM PREVIOUS MVR,  MILDLY ELEVATED MEAN GRADIENT OF 659mmHg/  SEVERE LAD/  MODERATE  RAD/  RV  MILD DILATED/   MILD   TR/  TRIVIAL  PR  . Cardiac catheterization  05-03-2001      NORMAL CORONARIES/  NORMAL LVF WITH LV MILDY DILATED/  SEVERE MITRAL INSUFFICIENCY    History   Social History  . Marital Status: Single    Spouse Name: N/A    Number of Children: 2  . Years of Education: N/A   Occupational History  .  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 Smoking status: Never Smoker   . Smokeless tobacco: Never Used  . Alcohol Use: 6.0 oz/week    4 Glasses of wine, 6 Cans of beer per week     Comment: previously wine/beer (bottle wine nightly).  stopped with results of echo  . Drug Use: No  . Sexual Activity: Not on file   Other Topics Concern  . Not on file   Social History Narrative   Caffeine: 2 cups soda/day   Lives alone, no pets, s/p 2 divorces   Occupation: Regulatory affairs officerLead Hardware Planner for [employer]   Activity: stays active at work, no regular exercise   Diet: some salads, trying to eat more healthy    ROS: no fevers or chills, productive cough, hemoptysis, dysphasia, odynophagia, melena, hematochezia, dysuria, hematuria, rash, seizure activity, orthopnea, PND, pedal edema, claudication. Remaining systems are negative.  Physical Exam: Well-developed well-nourished in no acute distress.  Skin is warm and dry.  HEENT is normal.  Neck is supple.  Chest is clear to auscultation with normal expansion.  Cardiovascular exam is tachycardic and irregular, Chrisp mechanical valve sound Abdominal exam nontender or distended. No masses palpated. Extremities show no edema. neuro grossly intact  ECG Atrial fibrillation rapid ventricular response at 115. Occasional PVCs or aberrantly conducted. Prior inferior infarct.

## 2014-03-20 NOTE — Assessment & Plan Note (Signed)
Continue present dose of Lasix. We will await most recent hemoglobin and potassium/renal function from primary care.

## 2014-03-20 NOTE — Patient Instructions (Signed)
Your physician wants you to follow-up in: ONE YEAR WITH DR Shelda Pal will receive a reminder letter in the mail two months in advance. If you don't receive a letter, please call our office to schedule the follow-up appointment.   INCREASE METOPROLOL TO 100 MG TWICE DAILY

## 2014-03-20 NOTE — Assessment & Plan Note (Addendum)
Continue SBE prophylaxis. Continue Coumadin and aspirin. 

## 2014-03-20 NOTE — Assessment & Plan Note (Signed)
Heart rate elevated. Increase metoprolol to 100 mg by mouth twice a day. Continue Coumadin.

## 2014-03-20 NOTE — Assessment & Plan Note (Signed)
Continue statin. 

## 2014-03-20 NOTE — Assessment & Plan Note (Signed)
LV function improved on most recent echo. Continue beta blocker and ACEI.

## 2014-03-20 NOTE — Assessment & Plan Note (Signed)
Continue present blood pressure medications. 

## 2014-03-22 ENCOUNTER — Other Ambulatory Visit: Payer: Self-pay | Admitting: Family Medicine

## 2014-03-22 DIAGNOSIS — E785 Hyperlipidemia, unspecified: Secondary | ICD-10-CM

## 2014-03-22 DIAGNOSIS — I4891 Unspecified atrial fibrillation: Secondary | ICD-10-CM

## 2014-03-22 DIAGNOSIS — Z125 Encounter for screening for malignant neoplasm of prostate: Secondary | ICD-10-CM

## 2014-03-22 DIAGNOSIS — I509 Heart failure, unspecified: Secondary | ICD-10-CM

## 2014-03-22 DIAGNOSIS — I10 Essential (primary) hypertension: Secondary | ICD-10-CM

## 2014-03-25 ENCOUNTER — Other Ambulatory Visit (INDEPENDENT_AMBULATORY_CARE_PROVIDER_SITE_OTHER): Payer: BC Managed Care – PPO

## 2014-03-25 ENCOUNTER — Other Ambulatory Visit: Payer: Self-pay | Admitting: *Deleted

## 2014-03-25 DIAGNOSIS — I4891 Unspecified atrial fibrillation: Secondary | ICD-10-CM

## 2014-03-25 DIAGNOSIS — I10 Essential (primary) hypertension: Secondary | ICD-10-CM

## 2014-03-25 DIAGNOSIS — Z125 Encounter for screening for malignant neoplasm of prostate: Secondary | ICD-10-CM

## 2014-03-25 DIAGNOSIS — I509 Heart failure, unspecified: Secondary | ICD-10-CM

## 2014-03-25 DIAGNOSIS — E785 Hyperlipidemia, unspecified: Secondary | ICD-10-CM

## 2014-03-25 DIAGNOSIS — Z79899 Other long term (current) drug therapy: Secondary | ICD-10-CM

## 2014-03-25 LAB — TSH: TSH: 1.74 u[IU]/mL (ref 0.35–4.50)

## 2014-03-25 LAB — CBC WITH DIFFERENTIAL/PLATELET
Basophils Absolute: 0.1 10*3/uL (ref 0.0–0.1)
Basophils Relative: 1.1 % (ref 0.0–3.0)
EOS ABS: 0.6 10*3/uL (ref 0.0–0.7)
Eosinophils Relative: 11.1 % — ABNORMAL HIGH (ref 0.0–5.0)
HEMATOCRIT: 40.9 % (ref 39.0–52.0)
HEMOGLOBIN: 13.8 g/dL (ref 13.0–17.0)
LYMPHS ABS: 1.3 10*3/uL (ref 0.7–4.0)
Lymphocytes Relative: 23 % (ref 12.0–46.0)
MCHC: 33.7 g/dL (ref 30.0–36.0)
MCV: 97.5 fl (ref 78.0–100.0)
Monocytes Absolute: 0.5 10*3/uL (ref 0.1–1.0)
Monocytes Relative: 8.5 % (ref 3.0–12.0)
NEUTROS ABS: 3.3 10*3/uL (ref 1.4–7.7)
Neutrophils Relative %: 56.3 % (ref 43.0–77.0)
Platelets: 249 10*3/uL (ref 150.0–400.0)
RBC: 4.2 Mil/uL — AB (ref 4.22–5.81)
RDW: 14.5 % (ref 11.5–15.5)
WBC: 5.8 10*3/uL (ref 4.0–10.5)

## 2014-03-25 LAB — LIPID PANEL
Cholesterol: 176 mg/dL (ref 0–200)
HDL: 51.1 mg/dL (ref >39.00)
LDL Cholesterol: 114 mg/dL — ABNORMAL HIGH (ref 0–99)
NonHDL: 124.9
TRIGLYCERIDES: 54 mg/dL (ref 0.0–149.0)
Total CHOL/HDL Ratio: 3
VLDL: 10.8 mg/dL (ref 0.0–40.0)

## 2014-03-25 LAB — BASIC METABOLIC PANEL
BUN: 15 mg/dL (ref 6–23)
CO2: 27 mEq/L (ref 19–32)
Calcium: 9.3 mg/dL (ref 8.4–10.5)
Chloride: 107 mEq/L (ref 96–112)
Creatinine, Ser: 0.9 mg/dL (ref 0.4–1.5)
GFR: 93.18 mL/min (ref >60.00)
Glucose, Bld: 85 mg/dL (ref 70–99)
Potassium: 4.9 mEq/L (ref 3.5–5.1)
SODIUM: 140 meq/L (ref 135–145)

## 2014-03-25 LAB — PSA: PSA: 1.84 ng/mL (ref 0.10–4.00)

## 2014-04-01 ENCOUNTER — Encounter: Payer: Self-pay | Admitting: Family Medicine

## 2014-04-01 ENCOUNTER — Ambulatory Visit (INDEPENDENT_AMBULATORY_CARE_PROVIDER_SITE_OTHER): Payer: BC Managed Care – PPO | Admitting: Family Medicine

## 2014-04-01 ENCOUNTER — Ambulatory Visit (INDEPENDENT_AMBULATORY_CARE_PROVIDER_SITE_OTHER)
Admission: RE | Admit: 2014-04-01 | Discharge: 2014-04-01 | Disposition: A | Discharge status: Home / Self Care | Payer: BC Managed Care – PPO | Source: Ambulatory Visit | Attending: Family Medicine | Admitting: Family Medicine

## 2014-04-01 VITALS — BP 120/70 | HR 78 | Temp 98.3°F | Ht 70.0 in | Wt 195.0 lb

## 2014-04-01 DIAGNOSIS — S4980XA Other specified injuries of shoulder and upper arm, unspecified arm, initial encounter: Secondary | ICD-10-CM

## 2014-04-01 DIAGNOSIS — S4992XA Unspecified injury of left shoulder and upper arm, initial encounter: Secondary | ICD-10-CM | POA: Insufficient documentation

## 2014-04-01 DIAGNOSIS — S46909A Unspecified injury of unspecified muscle, fascia and tendon at shoulder and upper arm level, unspecified arm, initial encounter: Secondary | ICD-10-CM

## 2014-04-01 DIAGNOSIS — Z0000 Encounter for general adult medical examination without abnormal findings: Secondary | ICD-10-CM
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
## 2014-04-01 NOTE — Progress Notes (Signed)
Pre visit review using our clinic review tool, if applicable. No additional management support is needed unless otherwise documented below in the visit note. 

## 2014-04-01 NOTE — Assessment & Plan Note (Signed)
Preventative protocols reviewed and updated unless pt declined. Discussed healthy diet and lifestyle.  Declines zostavax.

## 2014-04-01 NOTE — Patient Instructions (Signed)
Try changing lovastatin to nightly. Shoulder xray today - we will call you with results. In meantime, do stretching exercises provided, continue tylenol for pain, and use ice to shoulder. Let us know if not improving for referral to PT. Good to see you today, call us with quesitons
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
## 2014-04-01 NOTE — Addendum Note (Signed)
Addended by: Eustaquio Boyden on: 04/01/2014 02:08 PM   Modules accepted: Orders

## 2014-04-03 ENCOUNTER — Ambulatory Visit (INDEPENDENT_AMBULATORY_CARE_PROVIDER_SITE_OTHER): Payer: BC Managed Care – PPO | Admitting: *Deleted

## 2014-04-03 DIAGNOSIS — Z7901 Long term (current) use of anticoagulants: Secondary | ICD-10-CM

## 2014-04-03 DIAGNOSIS — Z952 Presence of prosthetic heart valve: Secondary | ICD-10-CM

## 2014-04-03 DIAGNOSIS — Z5181 Encounter for therapeutic drug level monitoring: Secondary | ICD-10-CM

## 2014-04-03 DIAGNOSIS — I4891 Unspecified atrial fibrillation: Secondary | ICD-10-CM

## 2014-04-03 DIAGNOSIS — Z954 Presence of other heart-valve replacement: Secondary | ICD-10-CM

## 2014-04-03 LAB — POCT INR: INR: 3.9

## 2014-04-08 ENCOUNTER — Ambulatory Visit: Payer: BC Managed Care – PPO | Admitting: Cardiology

## 2014-04-15 ENCOUNTER — Ambulatory Visit (INDEPENDENT_AMBULATORY_CARE_PROVIDER_SITE_OTHER): Payer: BC Managed Care – PPO | Admitting: *Deleted

## 2014-04-15 DIAGNOSIS — Z952 Presence of prosthetic heart valve: Secondary | ICD-10-CM

## 2014-04-15 DIAGNOSIS — I4891 Unspecified atrial fibrillation: Secondary | ICD-10-CM

## 2014-04-15 DIAGNOSIS — Z954 Presence of other heart-valve replacement: Secondary | ICD-10-CM

## 2014-04-15 DIAGNOSIS — Z5181 Encounter for therapeutic drug level monitoring: Secondary | ICD-10-CM

## 2014-04-15 DIAGNOSIS — Z7901 Long term (current) use of anticoagulants: Secondary | ICD-10-CM

## 2014-04-15 LAB — POCT INR: INR: 2.5

## 2014-04-29 ENCOUNTER — Other Ambulatory Visit: Payer: Self-pay | Admitting: Cardiology

## 2014-04-30 ENCOUNTER — Ambulatory Visit (INDEPENDENT_AMBULATORY_CARE_PROVIDER_SITE_OTHER): Payer: BC Managed Care – PPO | Admitting: Pulmonary Disease

## 2014-04-30 ENCOUNTER — Encounter: Payer: Self-pay | Admitting: Pulmonary Disease

## 2014-04-30 VITALS — BP 128/84 | HR 56 | Temp 97.7°F | Ht 70.0 in | Wt 188.6 lb

## 2014-04-30 DIAGNOSIS — G4733 Obstructive sleep apnea (adult) (pediatric): Secondary | ICD-10-CM

## 2014-04-30 NOTE — Patient Instructions (Signed)
Continue on cpap and your dental appliance Bring your card by in a few weeks and we can download it. Let us know if we need to send an order to your home care company for a nasal mask/chin strap followup with me in one year if doing well.

## 2014-04-30 NOTE — Progress Notes (Signed)
   Subjective:    Patient ID: Ian Bowen, male    DOB: 1950-11-12, 63 y.o.   MRN: [id number]  HPI The patient comes in today for followup of his obstructive sleep apnea. He is wearing CPAP successfully during the week, and uses a dental appliance on weekends when he travels. He feels that he has been doing very well with this, and is satisfied with his sleep and daytime alertness. He is also lost weight since last visit.   Review of Systems  Constitutional: Negative for fever and unexpected weight change.  HENT: Negative for congestion, dental problem, ear pain, nosebleeds, postnasal drip, rhinorrhea, sinus pressure, sneezing, sore throat and trouble swallowing.   Eyes: Negative for redness and itching.  Respiratory: Negative for cough, chest tightness, shortness of breath and wheezing.   Cardiovascular: Negative for palpitations and leg swelling.  Gastrointestinal: Negative for nausea and vomiting.  Genitourinary: Negative for dysuria.  Musculoskeletal: Negative for joint swelling.  Skin: Negative for rash.  Neurological: Negative for headaches.  Hematological: Does not bruise/bleed easily.  Psychiatric/Behavioral: Negative for dysphoric mood. The patient is not nervous/anxious.        Objective:   Physical Exam Well-developed male in no acute distress Nose without purulence or discharge noted No skin breakdown or pressure necrosis from the CPAP mask Neck without lymphadenopathy or thyromegaly Lower extremities without edema, no cyanosis Alert and oriented, moves all 4 extremities.       Assessment & Plan:

## 2014-04-30 NOTE — Assessment & Plan Note (Signed)
The patient is doing well with his CPAP and dental appliance, and is satisfied with his sleep and daytime alertness. We do need to get a download off his device, and he will bring his card by for [place] to download. I've also encouraged him to continue working on weight loss.

## 2014-05-07 ENCOUNTER — Ambulatory Visit (INDEPENDENT_AMBULATORY_CARE_PROVIDER_SITE_OTHER): Payer: BC Managed Care – PPO | Admitting: *Deleted

## 2014-05-07 DIAGNOSIS — Z7901 Long term (current) use of anticoagulants: Secondary | ICD-10-CM

## 2014-05-07 DIAGNOSIS — Z5181 Encounter for therapeutic drug level monitoring: Secondary | ICD-10-CM

## 2014-05-07 DIAGNOSIS — Z954 Presence of other heart-valve replacement: Secondary | ICD-10-CM

## 2014-05-07 DIAGNOSIS — I4891 Unspecified atrial fibrillation: Secondary | ICD-10-CM

## 2014-05-07 DIAGNOSIS — Z952 Presence of prosthetic heart valve: Secondary | ICD-10-CM

## 2014-05-07 LAB — POCT INR: INR: 4.1

## 2014-05-28 ENCOUNTER — Ambulatory Visit (INDEPENDENT_AMBULATORY_CARE_PROVIDER_SITE_OTHER): Payer: BC Managed Care – PPO | Admitting: Surgery

## 2014-05-28 DIAGNOSIS — I4891 Unspecified atrial fibrillation: Secondary | ICD-10-CM

## 2014-05-28 DIAGNOSIS — Z952 Presence of prosthetic heart valve: Secondary | ICD-10-CM

## 2014-05-28 DIAGNOSIS — Z5181 Encounter for therapeutic drug level monitoring: Secondary | ICD-10-CM

## 2014-05-28 DIAGNOSIS — Z7901 Long term (current) use of anticoagulants: Secondary | ICD-10-CM

## 2014-05-28 DIAGNOSIS — Z954 Presence of other heart-valve replacement: Secondary | ICD-10-CM

## 2014-05-28 LAB — POCT INR: INR: 4.2

## 2014-06-11 ENCOUNTER — Ambulatory Visit (INDEPENDENT_AMBULATORY_CARE_PROVIDER_SITE_OTHER): Payer: BC Managed Care – PPO | Admitting: *Deleted

## 2014-06-11 DIAGNOSIS — Z952 Presence of prosthetic heart valve: Secondary | ICD-10-CM

## 2014-06-11 DIAGNOSIS — Z7901 Long term (current) use of anticoagulants: Secondary | ICD-10-CM

## 2014-06-11 DIAGNOSIS — I4891 Unspecified atrial fibrillation: Secondary | ICD-10-CM

## 2014-06-11 DIAGNOSIS — Z5181 Encounter for therapeutic drug level monitoring: Secondary | ICD-10-CM

## 2014-06-11 DIAGNOSIS — Z954 Presence of other heart-valve replacement: Secondary | ICD-10-CM

## 2014-06-11 LAB — POCT INR: INR: 3.1

## 2014-07-02 ENCOUNTER — Ambulatory Visit (INDEPENDENT_AMBULATORY_CARE_PROVIDER_SITE_OTHER): Payer: BC Managed Care – PPO | Admitting: *Deleted

## 2014-07-02 DIAGNOSIS — Z5181 Encounter for therapeutic drug level monitoring: Secondary | ICD-10-CM

## 2014-07-02 DIAGNOSIS — I4891 Unspecified atrial fibrillation: Secondary | ICD-10-CM

## 2014-07-02 DIAGNOSIS — Z7901 Long term (current) use of anticoagulants: Secondary | ICD-10-CM

## 2014-07-02 DIAGNOSIS — Z952 Presence of prosthetic heart valve: Secondary | ICD-10-CM

## 2014-07-02 DIAGNOSIS — Z954 Presence of other heart-valve replacement: Secondary | ICD-10-CM

## 2014-07-02 LAB — POCT INR: INR: 3.1

## 2014-07-04 ENCOUNTER — Other Ambulatory Visit: Payer: Self-pay | Admitting: Cardiology

## 2014-07-09 IMAGING — CR DG SHOULDER 2+V*L*
3 series · 3 of 3 positions shown · non-contrast
Comparison: None.

ADDENDUM:
Also noted is an irregular fragment posteriorly on the Y-view which
appears to be a bone fragment arising from the posterior
scapula/acromion.
CLINICAL DATA: Shoulder pain

EXAM:
LEFT SHOULDER - 2+ VIEW

[view not recorded (1 of 3)]
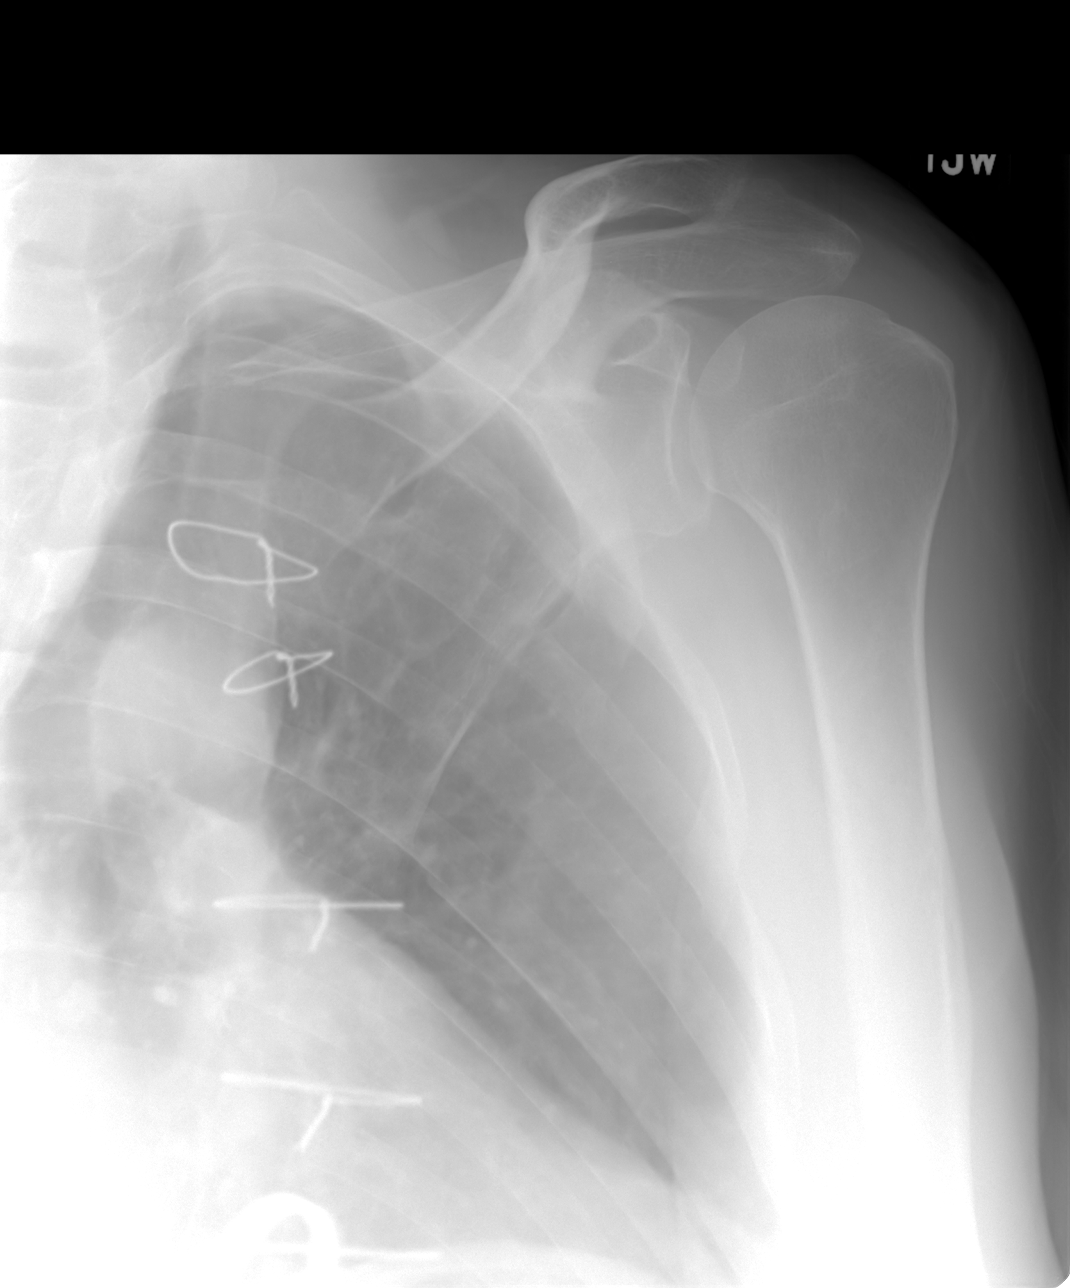

[view not recorded (2 of 3)]
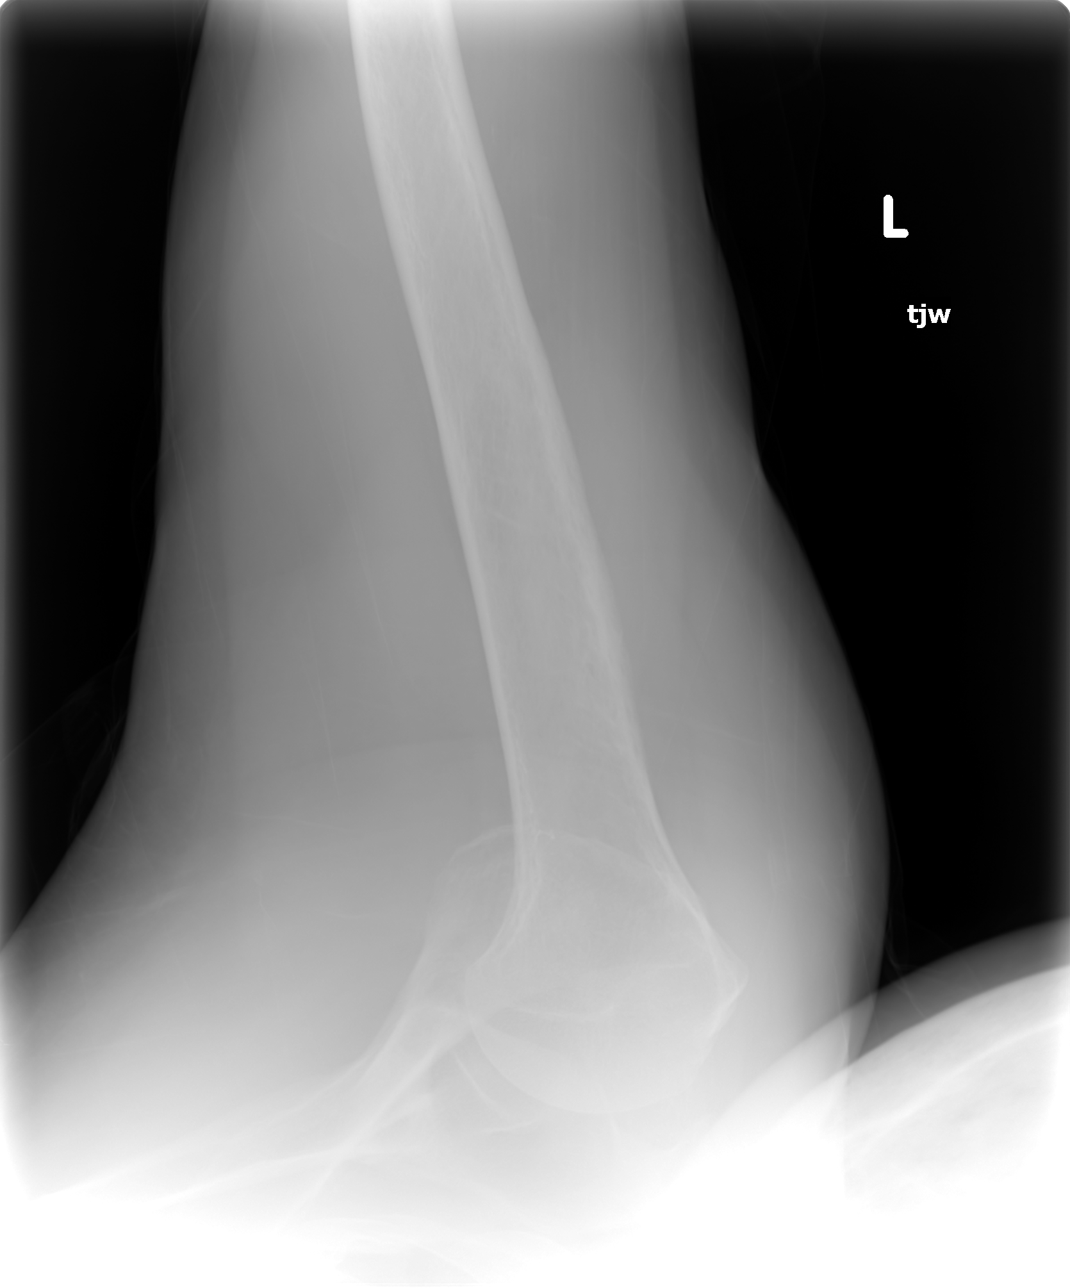

[view not recorded (3 of 3)]
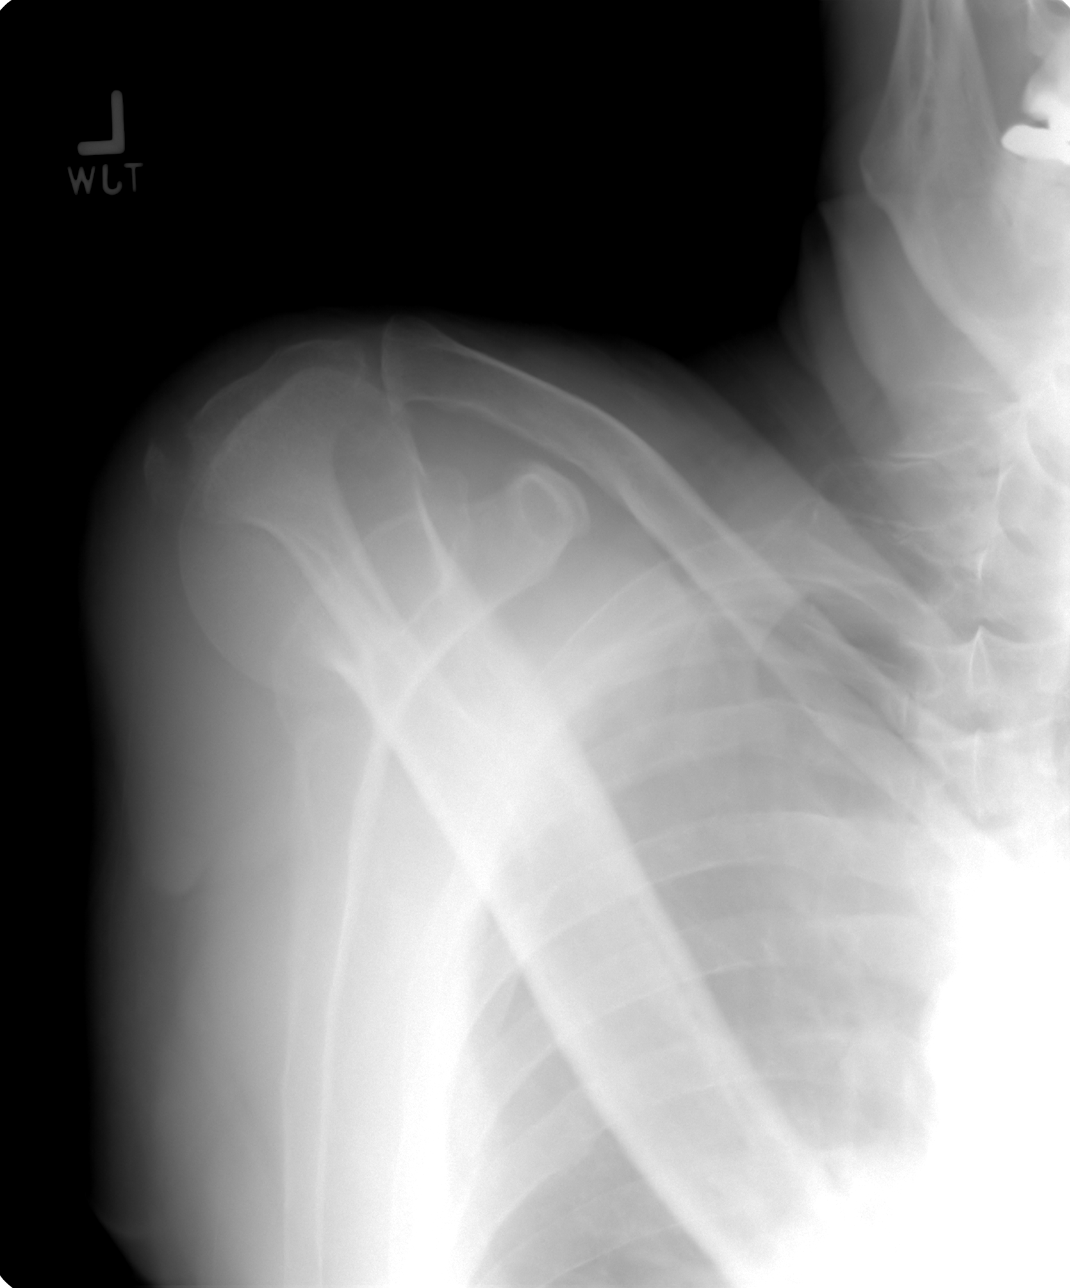

[3 of 3 positions shown; findings below may reference images not displayed]

FINDINGS: No acute bony abnormality within the left shoulder. Mild
osteoarthritis in the left AC joint.

Rib fractures involving the left third, fourth, and sixth ribs
posteriorly/laterally. These are age indeterminate. No pneumothorax.
IMPRESSION: No acute bony abnormality within the left shoulder.

Multiple age indeterminate left rib fractures as above.

## 2014-07-30 ENCOUNTER — Ambulatory Visit (INDEPENDENT_AMBULATORY_CARE_PROVIDER_SITE_OTHER): Payer: BC Managed Care – PPO | Admitting: Pharmacist

## 2014-07-30 DIAGNOSIS — Z954 Presence of other heart-valve replacement: Secondary | ICD-10-CM

## 2014-07-30 DIAGNOSIS — Z5181 Encounter for therapeutic drug level monitoring: Secondary | ICD-10-CM

## 2014-07-30 DIAGNOSIS — Z952 Presence of prosthetic heart valve: Secondary | ICD-10-CM

## 2014-07-30 DIAGNOSIS — I4891 Unspecified atrial fibrillation: Secondary | ICD-10-CM

## 2014-07-30 DIAGNOSIS — Z7901 Long term (current) use of anticoagulants: Secondary | ICD-10-CM

## 2014-07-30 LAB — POCT INR: INR: 3.7

## 2014-08-11 ENCOUNTER — Telehealth: Payer: Self-pay | Admitting: Pulmonary Disease

## 2014-08-11 NOTE — Telephone Encounter (Signed)
Download that was done on pt is from 04/2013. Called pt and LMTCB x1 to see if he can bring his chip in so I can download it or see if he wants the DME to do so?

## 2014-08-14 NOTE — Telephone Encounter (Signed)
Pt returned call.  Holly D Pryor ° °

## 2014-08-15 NOTE — Telephone Encounter (Signed)
Made pt aware of below. He will bring this by for me. Nothing further needed

## 2014-08-15 NOTE — Telephone Encounter (Signed)
Pt returned Mindy's call - [phone]

## 2014-08-15 NOTE — Telephone Encounter (Signed)
lmomtcb x1 

## 2014-08-19 ENCOUNTER — Telehealth: Payer: Self-pay | Admitting: Pulmonary Disease

## 2014-08-19 DIAGNOSIS — G4733 Obstructive sleep apnea (adult) (pediatric): Secondary | ICD-10-CM

## 2014-08-19 NOTE — Telephone Encounter (Signed)
Pt came by the office this morning and I did get download off machine. Pt aware this is in [facility] look at. Please advise thanks

## 2014-08-25 NOTE — Telephone Encounter (Signed)
LMTCB

## 2014-08-25 NOTE — Telephone Encounter (Signed)
Let pt know that he has a little bit of breakthru apnea with his current pressure setting, but no significant mask leak.  He is having about 6 events/hr on average with his current setting, with normal being less than 5.  If he is doing well and having no issues, could leave well enough alone.  The other option is to increase automatic pressure range to 5-20cm where he can be covered on the breakthru events.  Either way ok for me.  If he wants to increase, ok to send order.

## 2014-08-26 NOTE — Telephone Encounter (Signed)
Called spoke with patient and discussed download results with him.  Pt voiced his understanding but does wonder at increasing the pressure range.  Pt stated that he already feels as though he is smothering and cannot get enough air at times (attributes this to the low end of his pressure range) but does not want the upper range too much that it will cause mask leaks.  Pt asks if KC would recommend increasing the pressure range or have his machine checked for any malfunction (machine is 62 years old and he has felt the smothering sensation x6-7 months).   Dr Shelle Iron please advise, thank you.

## 2014-08-26 NOTE — Telephone Encounter (Signed)
Let's try the increased range for a few weeks, then give me some feedback about "smothering", mask leaks, etc.  Then we can make decisions.

## 2014-08-26 NOTE — Telephone Encounter (Signed)
Called pt. Made aware of below. Order sent for pressure change.

## 2014-08-27 ENCOUNTER — Ambulatory Visit (INDEPENDENT_AMBULATORY_CARE_PROVIDER_SITE_OTHER): Payer: BC Managed Care – PPO | Admitting: *Deleted

## 2014-08-27 DIAGNOSIS — Z5181 Encounter for therapeutic drug level monitoring: Secondary | ICD-10-CM

## 2014-08-27 DIAGNOSIS — I4891 Unspecified atrial fibrillation: Secondary | ICD-10-CM

## 2014-08-27 DIAGNOSIS — Z7901 Long term (current) use of anticoagulants: Secondary | ICD-10-CM

## 2014-08-27 DIAGNOSIS — Z952 Presence of prosthetic heart valve: Secondary | ICD-10-CM

## 2014-08-27 DIAGNOSIS — Z954 Presence of other heart-valve replacement: Secondary | ICD-10-CM

## 2014-08-27 LAB — POCT INR: INR: 2.3

## 2014-09-24 ENCOUNTER — Ambulatory Visit (INDEPENDENT_AMBULATORY_CARE_PROVIDER_SITE_OTHER): Payer: BC Managed Care – PPO | Admitting: *Deleted

## 2014-09-24 DIAGNOSIS — I4891 Unspecified atrial fibrillation: Secondary | ICD-10-CM

## 2014-09-24 DIAGNOSIS — Z952 Presence of prosthetic heart valve: Secondary | ICD-10-CM

## 2014-09-24 DIAGNOSIS — Z7901 Long term (current) use of anticoagulants: Secondary | ICD-10-CM

## 2014-09-24 DIAGNOSIS — Z5181 Encounter for therapeutic drug level monitoring: Secondary | ICD-10-CM

## 2014-09-24 DIAGNOSIS — Z954 Presence of other heart-valve replacement: Secondary | ICD-10-CM

## 2014-09-24 LAB — POCT INR: INR: 3.4

## 2014-10-22 ENCOUNTER — Ambulatory Visit (INDEPENDENT_AMBULATORY_CARE_PROVIDER_SITE_OTHER): Payer: BLUE CROSS/BLUE SHIELD | Admitting: *Deleted

## 2014-10-22 DIAGNOSIS — Z952 Presence of prosthetic heart valve: Secondary | ICD-10-CM

## 2014-10-22 DIAGNOSIS — Z5181 Encounter for therapeutic drug level monitoring: Secondary | ICD-10-CM

## 2014-10-22 DIAGNOSIS — Z954 Presence of other heart-valve replacement: Secondary | ICD-10-CM

## 2014-10-22 DIAGNOSIS — Z7901 Long term (current) use of anticoagulants: Secondary | ICD-10-CM

## 2014-10-22 DIAGNOSIS — I4891 Unspecified atrial fibrillation: Secondary | ICD-10-CM

## 2014-10-22 LAB — POCT INR: INR: 4.2

## 2014-11-06 ENCOUNTER — Other Ambulatory Visit: Payer: Self-pay | Admitting: Family Medicine

## 2014-11-12 ENCOUNTER — Ambulatory Visit (INDEPENDENT_AMBULATORY_CARE_PROVIDER_SITE_OTHER): Payer: BLUE CROSS/BLUE SHIELD | Admitting: *Deleted

## 2014-11-12 DIAGNOSIS — Z952 Presence of prosthetic heart valve: Secondary | ICD-10-CM

## 2014-11-12 DIAGNOSIS — Z954 Presence of other heart-valve replacement: Secondary | ICD-10-CM

## 2014-11-12 DIAGNOSIS — Z7901 Long term (current) use of anticoagulants: Secondary | ICD-10-CM

## 2014-11-12 DIAGNOSIS — I4891 Unspecified atrial fibrillation: Secondary | ICD-10-CM

## 2014-11-12 DIAGNOSIS — Z5181 Encounter for therapeutic drug level monitoring: Secondary | ICD-10-CM

## 2014-11-12 LAB — POCT INR: INR: 3.1

## 2014-11-30 ENCOUNTER — Other Ambulatory Visit: Payer: Self-pay | Admitting: Family Medicine

## 2014-12-10 ENCOUNTER — Ambulatory Visit (INDEPENDENT_AMBULATORY_CARE_PROVIDER_SITE_OTHER): Payer: BLUE CROSS/BLUE SHIELD | Admitting: *Deleted

## 2014-12-10 DIAGNOSIS — Z952 Presence of prosthetic heart valve: Secondary | ICD-10-CM

## 2014-12-10 DIAGNOSIS — Z954 Presence of other heart-valve replacement: Secondary | ICD-10-CM

## 2014-12-10 DIAGNOSIS — Z7901 Long term (current) use of anticoagulants: Secondary | ICD-10-CM

## 2014-12-10 DIAGNOSIS — Z5181 Encounter for therapeutic drug level monitoring: Secondary | ICD-10-CM

## 2014-12-10 DIAGNOSIS — I4891 Unspecified atrial fibrillation: Secondary | ICD-10-CM

## 2014-12-10 LAB — POCT INR: INR: 2.5

## 2015-01-07 ENCOUNTER — Ambulatory Visit (INDEPENDENT_AMBULATORY_CARE_PROVIDER_SITE_OTHER): Payer: BLUE CROSS/BLUE SHIELD | Admitting: *Deleted

## 2015-01-07 DIAGNOSIS — Z7901 Long term (current) use of anticoagulants: Secondary | ICD-10-CM

## 2015-01-07 DIAGNOSIS — Z952 Presence of prosthetic heart valve: Secondary | ICD-10-CM

## 2015-01-07 DIAGNOSIS — Z954 Presence of other heart-valve replacement: Secondary | ICD-10-CM | POA: Diagnosis not present

## 2015-01-07 DIAGNOSIS — I4891 Unspecified atrial fibrillation: Secondary | ICD-10-CM | POA: Diagnosis not present

## 2015-01-07 DIAGNOSIS — Z5181 Encounter for therapeutic drug level monitoring: Secondary | ICD-10-CM | POA: Diagnosis not present

## 2015-01-07 LAB — POCT INR: INR: 3.6

## 2015-01-12 ENCOUNTER — Telehealth: Payer: Self-pay | Admitting: Cardiology

## 2015-01-13 ENCOUNTER — Telehealth: Payer: Self-pay | Admitting: *Deleted

## 2015-01-13 NOTE — Telephone Encounter (Signed)
LMOM to return call in reference to flu vaccine.

## 2015-01-19 NOTE — Telephone Encounter (Signed)
Close encounter 

## 2015-02-04 ENCOUNTER — Ambulatory Visit (INDEPENDENT_AMBULATORY_CARE_PROVIDER_SITE_OTHER): Payer: BLUE CROSS/BLUE SHIELD | Admitting: *Deleted

## 2015-02-04 DIAGNOSIS — Z7901 Long term (current) use of anticoagulants: Secondary | ICD-10-CM

## 2015-02-04 DIAGNOSIS — Z5181 Encounter for therapeutic drug level monitoring: Secondary | ICD-10-CM | POA: Diagnosis not present

## 2015-02-04 DIAGNOSIS — Z952 Presence of prosthetic heart valve: Secondary | ICD-10-CM

## 2015-02-04 DIAGNOSIS — I4891 Unspecified atrial fibrillation: Secondary | ICD-10-CM

## 2015-02-04 DIAGNOSIS — Z954 Presence of other heart-valve replacement: Secondary | ICD-10-CM | POA: Diagnosis not present

## 2015-02-04 LAB — POCT INR: INR: 1.8

## 2015-02-13 NOTE — Progress Notes (Signed)
HPI: FU cardiomyopathy and atrial fibrilllation. Patient underwent mitral valve replacement with a CarboMedics bileaflet valve in September of 2002. Note preoperative cardiac catheterization showed normal coronary arteries and normal LV function. He apparently had subsequent repair in 2006 due to paravalvular leak. Transesophageal echocardiogram in May 2009 showed normally functioning mechanical mitral valve with no leak. LV function was normal. Patient also has a history of paroxysmal atrial fibrillation. He was seen at [facility] in September of 2012 with an episode of atrial fibrillation. Cardizem was added to his medical regimen as was Lasix and potassium. A Myoview was performed in September of 2012 and showed inferior apical infarct with minimal reversibility. Ejection fraction 18%. Echocardiogram in September of 2012 showed an ejection fraction of 20%, diffuse hypokinesis, normally functioning prosthetic mitral valve, biatrial enlargement, right ventricular enlargement with decreased function. We felt his cardiomyopathy may be tachycardia mediated vs ETOH. Holter in Oct 2012 showed his rate mildly increased and we increased his toprol. Echo repeated in July 2014 and showed EF 50-55. Prosthetic MV with mean gradient 9 mmHg, biatrial enlargement, oscillating density in LV cavity most likely related to residual MV apparatus. Since I last saw him, the patient denies any dyspnea on exertion, orthopnea, PND, pedal edema, palpitations, syncope or chest pain.   Current Outpatient Prescriptions  Medication Sig Dispense Refill  . acetaminophen (TYLENOL) 500 MG tablet Take 1,000 mg by mouth every 6 (six) hours as needed for moderate pain.    Marland Kitchen aspirin 81 MG tablet Take 81 mg by mouth at bedtime.     . furosemide (LASIX) 20 MG tablet TAKE 1 TABLET DAILY 90 tablet 0  . lisinopril (PRINIVIL,ZESTRIL) 5 MG tablet Take 1 tablet (5 mg total) by mouth daily. 90 tablet 4  . lovastatin (MEVACOR) 20 MG  tablet Take 20 mg by mouth daily at 6 PM.    . metoprolol succinate (TOPROL-XL) 100 MG 24 hr tablet Take 1 tablet (100 mg total) by mouth 2 (two) times daily. Take with or immediately following a meal. 180 tablet 3  . tadalafil (CIALIS) 5 MG tablet Take this medication as directed by your pharmacy 90 tablet 1  . warfarin (COUMADIN) 5 MG tablet TAKE AS DIRECTED BY        ANTICOAGULATION CLINIC 120 tablet 1   No current facility-administered medications for this visit.     Past Medical History  Diagnosis Date  . S/P MVR (mitral valve replacement)     SEPT  2002   IN   [place]  . HTN (hypertension)   . GERD (gastroesophageal reflux disease)   . ED (erectile dysfunction)   . Cardiomyopathy     per echo  05-07-2013   ef  50%  . Atrial fibrillation, permanent   . Hyperlipidemia   . CHF (congestive heart failure)     monitored by dr Jens Som--  euvolemic  . OSA on CPAP     moderate OSA   PER STUDY  09-02-2012  W/ DR CLANCE  . Prolapsed internal hemorrhoids, grade 3     Past Surgical History  Procedure Laterality Date  . Mitral valve replacement  SEPT  2002   ([facility] [place])    CarboMedics bileaflet percardial valve  . Cardioversion  2006  . Right knee surgery    . 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 Retinal surx      OD  . Colonoscopy  2011  per pt report normal  . Mitral valve repair  2006   ( in [place])    secondary to paravalvular leak  . Knee arthroscopy Left   . Cardiovascular stress test  07-15-2011   DR CRENSHAW    SEVERE LV SYSTOLIC DYSFUNCTION WITH INFEROAPICAL SCAR WITH MINIMAL REVERSIBILITY/   EF 18%  . Transthoracic echocardiogram  05-07-2013    DR CRENSHAW    MILD LVH/  EF  50%/  OSCILLATING DENSITY IN LV CAVITY MOST LIKELY RESIDUAL MV APPARATUS FROM PREVIOUS MVR,  MILDLY ELEVATED MEAN GRADIENT OF 56mmHg/  SEVERE LAD/  MODERATE  RAD/  RV  MILD DILATED/   MILD   TR/  TRIVIAL  PR  . Cardiac catheterization  05-03-2001      NORMAL  CORONARIES/  NORMAL LVF WITH LV MILDY DILATED/  SEVERE MITRAL INSUFFICIENCY    History   Social History  . Marital Status: Single    Spouse Name: N/A  . Number of Children: 2  . Years of Education: N/A   Occupational History  .  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 Smoking status: Never Smoker   . Smokeless tobacco: Never Used  . Alcohol Use: 6.0 oz/week    4 Glasses of wine, 6 Cans of beer per week     Comment: previously wine/beer (bottle wine nightly).  stopped with results of echo  . Drug Use: No  . Sexual Activity: Not on file   Other Topics Concern  . Not on file   Social History Narrative   Caffeine: 2 cups soda/day   Lives alone, no pets, s/p 2 divorces   Occupation: Regulatory affairs officer for [employer]   Activity: stays active at work, no regular exercise   Diet: some salads, trying to eat more healthy    ROS: no fevers or chills, productive cough, hemoptysis, dysphasia, odynophagia, melena, hematochezia, dysuria, hematuria, rash, seizure activity, orthopnea, PND, pedal edema, claudication. Remaining systems are negative.  Physical Exam: Well-developed well-nourished in no acute distress.  Skin is warm and dry.  HEENT is normal.  Neck is supple.  Chest is clear to auscultation with normal expansion.  Cardiovascular exam is irregular, crisp mechanical valve sounds Abdominal exam nontender or distended. No masses palpated. Extremities show no edema. neuro grossly intact  ECG atrial fibrillation at a rate of 93. No ST changes.

## 2015-02-17 ENCOUNTER — Encounter: Payer: Self-pay | Admitting: Cardiology

## 2015-02-17 ENCOUNTER — Ambulatory Visit (INDEPENDENT_AMBULATORY_CARE_PROVIDER_SITE_OTHER): Payer: BLUE CROSS/BLUE SHIELD | Admitting: Cardiology

## 2015-02-17 ENCOUNTER — Other Ambulatory Visit: Payer: Self-pay

## 2015-02-17 ENCOUNTER — Ambulatory Visit (INDEPENDENT_AMBULATORY_CARE_PROVIDER_SITE_OTHER): Payer: BLUE CROSS/BLUE SHIELD

## 2015-02-17 VITALS — BP 120/80 | HR 93 | Ht 70.5 in | Wt 194.8 lb

## 2015-02-17 DIAGNOSIS — I4891 Unspecified atrial fibrillation: Secondary | ICD-10-CM

## 2015-02-17 DIAGNOSIS — Z7901 Long term (current) use of anticoagulants: Secondary | ICD-10-CM | POA: Diagnosis not present

## 2015-02-17 DIAGNOSIS — Z952 Presence of prosthetic heart valve: Secondary | ICD-10-CM

## 2015-02-17 DIAGNOSIS — I5032 Chronic diastolic (congestive) heart failure: Secondary | ICD-10-CM | POA: Diagnosis not present

## 2015-02-17 DIAGNOSIS — E785 Hyperlipidemia, unspecified: Secondary | ICD-10-CM

## 2015-02-17 DIAGNOSIS — Z5181 Encounter for therapeutic drug level monitoring: Secondary | ICD-10-CM

## 2015-02-17 DIAGNOSIS — Z954 Presence of other heart-valve replacement: Secondary | ICD-10-CM

## 2015-02-17 DIAGNOSIS — I429 Cardiomyopathy, unspecified: Secondary | ICD-10-CM

## 2015-02-17 LAB — POCT INR: INR: 3

## 2015-02-17 MED ORDER — LOVASTATIN 20 MG PO TABS: 20.0000 mg | ORAL_TABLET | Freq: Every day | ORAL | Status: DC

## 2015-02-17 MED ORDER — METOPROLOL SUCCINATE ER 100 MG PO TB24: 100.0000 mg | ORAL_TABLET | Freq: Two times a day (BID) | ORAL | Status: DC

## 2015-02-17 MED ORDER — LISINOPRIL 5 MG PO TABS: 5.0000 mg | ORAL_TABLET | Freq: Every day | ORAL | Status: DC

## 2015-02-17 MED ORDER — WARFARIN SODIUM 5 MG PO TABS: ORAL_TABLET | ORAL | Status: DC

## 2015-02-17 MED ORDER — FUROSEMIDE 20 MG PO TABS: 20.0000 mg | ORAL_TABLET | Freq: Every day | ORAL | Status: DC

## 2015-02-17 MED ORDER — TADALAFIL 5 MG PO TABS: ORAL_TABLET | ORAL | Status: DC

## 2015-02-17 NOTE — Patient Instructions (Signed)
Your physician wants you to follow-up in: ONE YEAR WITH DR CRENSHAW You will receive a reminder letter in the mail two months in advance. If you don't receive a letter, please call our office to schedule the follow-up appointment.  

## 2015-02-17 NOTE — Assessment & Plan Note (Signed)
Continue statin. 

## 2015-02-17 NOTE — Addendum Note (Signed)
Addended by: Freddi Starr on: 02/17/2015 10:35 AM   Modules accepted: Orders

## 2015-02-17 NOTE — Assessment & Plan Note (Signed)
Continue SBE prophylaxis. 

## 2015-02-17 NOTE — Assessment & Plan Note (Signed)
Improved on most recent echo. Continue ACE inhibitor and beta blocker. 

## 2015-02-17 NOTE — Assessment & Plan Note (Signed)
euvolemic on examination. Continue present dose of Lasix. Potassium and renal function monitored by primary care.

## 2015-02-17 NOTE — Assessment & Plan Note (Signed)
Blood pressure controlled. Continue present medications. 

## 2015-02-17 NOTE — Assessment & Plan Note (Signed)
Patient remains in permanent atrial fibrillation. Continue beta blocker and Coumadin. Hemoglobin monitored by primary care.

## 2015-02-18 MED ORDER — TADALAFIL 5 MG PO TABS: ORAL_TABLET | ORAL | Status: DC

## 2015-02-18 NOTE — Telephone Encounter (Signed)
Ok to refill; no NTG within 24 hours of use Ian Bowen

## 2015-02-18 NOTE — Telephone Encounter (Signed)
Rx(s) sent to pharmacy electronically.  

## 2015-02-28 ENCOUNTER — Other Ambulatory Visit: Payer: Self-pay | Admitting: Family Medicine

## 2015-02-28 DIAGNOSIS — I10 Essential (primary) hypertension: Secondary | ICD-10-CM

## 2015-02-28 DIAGNOSIS — E785 Hyperlipidemia, unspecified: Secondary | ICD-10-CM

## 2015-02-28 DIAGNOSIS — Z125 Encounter for screening for malignant neoplasm of prostate: Secondary | ICD-10-CM

## 2015-03-04 ENCOUNTER — Other Ambulatory Visit (INDEPENDENT_AMBULATORY_CARE_PROVIDER_SITE_OTHER): Payer: BLUE CROSS/BLUE SHIELD

## 2015-03-04 DIAGNOSIS — E785 Hyperlipidemia, unspecified: Secondary | ICD-10-CM

## 2015-03-04 DIAGNOSIS — Z125 Encounter for screening for malignant neoplasm of prostate: Secondary | ICD-10-CM

## 2015-03-04 DIAGNOSIS — I10 Essential (primary) hypertension: Secondary | ICD-10-CM

## 2015-03-04 LAB — LIPID PANEL
CHOLESTEROL: 125 mg/dL (ref 0–200)
HDL: 38 mg/dL — AB (ref >39.00)
LDL Cholesterol: 68 mg/dL (ref 0–99)
NONHDL: 87
TRIGLYCERIDES: 95 mg/dL (ref 0.0–149.0)
Total CHOL/HDL Ratio: 3
VLDL: 19 mg/dL (ref 0.0–40.0)

## 2015-03-04 LAB — BASIC METABOLIC PANEL
BUN: 13 mg/dL (ref 6–23)
CHLORIDE: 105 meq/L (ref 96–112)
CO2: 25 mEq/L (ref 19–32)
CREATININE: 0.94 mg/dL (ref 0.40–1.50)
Calcium: 9.1 mg/dL (ref 8.4–10.5)
GFR: 86.09 mL/min (ref >60.00)
Glucose, Bld: 90 mg/dL (ref 70–99)
POTASSIUM: 3.9 meq/L (ref 3.5–5.1)
Sodium: 139 mEq/L (ref 135–145)

## 2015-03-04 LAB — PSA: PSA: 0.86 ng/mL (ref 0.10–4.00)

## 2015-03-11 ENCOUNTER — Encounter: Payer: Self-pay | Admitting: Family Medicine

## 2015-03-11 ENCOUNTER — Ambulatory Visit (INDEPENDENT_AMBULATORY_CARE_PROVIDER_SITE_OTHER): Payer: BLUE CROSS/BLUE SHIELD | Admitting: Family Medicine

## 2015-03-11 ENCOUNTER — Ambulatory Visit (INDEPENDENT_AMBULATORY_CARE_PROVIDER_SITE_OTHER): Payer: BLUE CROSS/BLUE SHIELD | Admitting: *Deleted

## 2015-03-11 ENCOUNTER — Telehealth: Payer: Self-pay | Admitting: *Deleted

## 2015-03-11 VITALS — BP 118/76 | HR 53 | Temp 98.2°F | Ht 69.5 in | Wt 194.5 lb

## 2015-03-11 DIAGNOSIS — Z789 Other specified health status: Secondary | ICD-10-CM

## 2015-03-11 DIAGNOSIS — Z0000 Encounter for general adult medical examination without abnormal findings: Secondary | ICD-10-CM | POA: Diagnosis not present

## 2015-03-11 DIAGNOSIS — Z5181 Encounter for therapeutic drug level monitoring: Secondary | ICD-10-CM | POA: Diagnosis not present

## 2015-03-11 DIAGNOSIS — Z7901 Long term (current) use of anticoagulants: Secondary | ICD-10-CM

## 2015-03-11 DIAGNOSIS — I10 Essential (primary) hypertension: Secondary | ICD-10-CM

## 2015-03-11 DIAGNOSIS — G4733 Obstructive sleep apnea (adult) (pediatric): Secondary | ICD-10-CM

## 2015-03-11 DIAGNOSIS — Z954 Presence of other heart-valve replacement: Secondary | ICD-10-CM

## 2015-03-11 DIAGNOSIS — K219 Gastro-esophageal reflux disease without esophagitis: Secondary | ICD-10-CM

## 2015-03-11 DIAGNOSIS — I5032 Chronic diastolic (congestive) heart failure: Secondary | ICD-10-CM | POA: Diagnosis not present

## 2015-03-11 DIAGNOSIS — E785 Hyperlipidemia, unspecified: Secondary | ICD-10-CM

## 2015-03-11 DIAGNOSIS — Z7289 Other problems related to lifestyle: Secondary | ICD-10-CM | POA: Insufficient documentation

## 2015-03-11 DIAGNOSIS — I4891 Unspecified atrial fibrillation: Secondary | ICD-10-CM

## 2015-03-11 DIAGNOSIS — F1099 Alcohol use, unspecified with unspecified alcohol-induced disorder: Secondary | ICD-10-CM

## 2015-03-11 DIAGNOSIS — Z952 Presence of prosthetic heart valve: Secondary | ICD-10-CM

## 2015-03-11 LAB — POCT INR: INR: 8

## 2015-03-11 LAB — PROTIME-INR
INR: 9 ratio (ref 0.8–1.0)
Prothrombin Time: 93.9 s (ref 9.6–13.1)

## 2015-03-11 NOTE — Telephone Encounter (Signed)
Critical labs INR 9.0, PT 93.9, MD notified verbally, and copy given to him.

## 2015-03-11 NOTE — Progress Notes (Signed)
BP 118/76 mmHg  Pulse 53  Temp(Src) 98.2 F (36.8 C) (Oral)  Ht 5' 9.5" (1.765 m)  Wt 194 lb 8 oz (88.225 kg)  BMI 28.32 kg/m2  SpO2 98%   CC: CPE  Subjective:    Patient ID: Ian Bowen, male    DOB: 1951/08/23, 64 y.o.   MRN: [id number]  HPI: Ian Bowen is a 64 y.o. male presenting on 03/11/2015 for Annual Exam   Ok to get CPE early this year. Increased stress recently. Lost job at [employer], but has new job starting next month.  INR this morning at coumadin clinic 8.0. Denies acute bleeding. Will check stat PT/INR and coumadin clinic will dose accordingly this morning. Pt attributes to increased EtOH over weekend.   Alcohol - 1 bottle red wine every other day. No hangover effect.  Tolerating medications well.  Preventative: COLONOSCOPY Date: 2011 per pt report normal. Done in [facility]. states rec rpt 10 yrs. 2nd normal per patient. Prostate - checked yearly, always normal. Tdap 2011  Flu shot - doesn't take. Got sick last time he got flu shot  Zostavax - discussed - declines for now Seat belt use discussed.  Sunscreen use discussed. No changing spots on skin.  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 Interim medical history since our last visit reviewed. Allergies and medications reviewed and updated. Current Outpatient Prescriptions on File Prior to Visit  Medication Sig  . acetaminophen (TYLENOL) 500 MG tablet Take 1,000 mg by mouth every 6 (six) hours as needed for moderate pain.  Marland Kitchen aspirin 81 MG tablet Take 81 mg by mouth at bedtime.   . furosemide (LASIX) 20 MG tablet Take 1 tablet (20 mg total) by mouth daily.  Marland Kitchen lisinopril (PRINIVIL,ZESTRIL) 5 MG  tablet Take 1 tablet (5 mg total) by mouth daily.  Marland Kitchen lovastatin (MEVACOR) 20 MG tablet Take 1 tablet (20 mg total) by mouth daily at 6 PM.  . metoprolol succinate (TOPROL-XL) 100 MG 24 hr tablet Take 1 tablet (100 mg total) by mouth 2 (two) times daily. Take with or immediately following a meal.  . tadalafil (CIALIS) 5 MG tablet Take this medication as directed by your pharmacy. No NTG within 24 hours of use.  . warfarin (COUMADIN) 5 MG tablet TAKE AS DIRECTED BY        ANTICOAGULATION CLINIC   No current facility-administered medications on file prior to visit.    Review of Systems  Constitutional: Negative for fever, chills, activity change, appetite change, fatigue and unexpected weight change.  HENT: Negative for hearing loss.   Eyes: Negative for visual disturbance.  Respiratory: Negative for cough, chest tightness, shortness of breath and wheezing.   Cardiovascular: Negative for chest pain, palpitations and leg swelling.  Gastrointestinal: Negative for nausea, vomiting, abdominal pain, diarrhea, constipation, blood in stool and abdominal distention.  Genitourinary: Negative for hematuria and difficulty urinating.  Musculoskeletal: Negative for myalgias, arthralgias and neck pain.  Skin: Negative for rash.  Neurological: Negative for dizziness, seizures, syncope and headaches.  Hematological: Negative for adenopathy. Does not bruise/bleed easily.  Psychiatric/Behavioral: Negative for dysphoric mood. The patient is not nervous/anxious.    Per HPI unless specifically indicated above  Objective:    BP 118/76 mmHg  Pulse 53  Temp(Src) 98.2 F (36.8 C) (Oral)  Ht 5' 9.5" (1.765 m)  Wt 194 lb 8 oz (88.225 kg)  BMI 28.32 kg/m2  SpO2 98%  Wt Readings from Last 3 Encounters:  03/11/15 194 lb 8 oz (88.225 kg)  02/17/15 194 lb 12.8 oz (88.361 kg)  04/30/14 188 lb 9.6 oz (85.548 kg)    Physical Exam  Constitutional: He is oriented to person, place, and time. He appears  well-developed and well-nourished. No distress.  HENT:  Head: Normocephalic and atraumatic.  Right Ear: Hearing, tympanic membrane, external ear and ear canal normal.  Left Ear: Hearing, tympanic membrane, external ear and ear canal normal.  Nose: Nose normal.  Mouth/Throat: Uvula is midline, oropharynx is clear and moist and mucous membranes are normal. No oropharyngeal exudate, posterior oropharyngeal edema or posterior oropharyngeal erythema.  Eyes: Conjunctivae and EOM are normal. Pupils are equal, round, and reactive to light. No scleral icterus.  Neck: Normal range of motion. Neck supple. Carotid bruit is not present. No thyromegaly present.  Cardiovascular: Normal rate, regular rhythm, normal heart sounds and intact distal pulses.   No murmur heard. Pulses:      Radial pulses are 2+ on the right side, and 2+ on the left side.  Pulmonary/Chest: Effort normal and breath sounds normal. No respiratory distress. He has no wheezes. He has no rales.  Abdominal: Soft. Bowel sounds are normal. He exhibits no distension and no mass. There is no tenderness. There is no rebound and no guarding.  Genitourinary: Rectum normal and prostate normal. Rectal exam shows no external hemorrhoid, no internal hemorrhoid, no fissure, no mass, no tenderness and anal tone normal. Prostate is not enlarged (20gm) and not tender.  Musculoskeletal: Normal range of motion. He exhibits no edema.  Lymphadenopathy:    He has no cervical adenopathy.  Neurological: He is alert and oriented to person, place, and time.  CN grossly intact, station and gait intact  Skin: Skin is warm and dry. No rash noted.  Psychiatric: He has a normal mood and affect. His behavior is normal. Judgment and thought content normal.  Nursing note and vitals reviewed.  Results for orders placed or performed in visit on 03/11/15  POCT INR  Result Value Ref Range   INR 8.0       Assessment & Plan:   Problem List Items Addressed This Visit     Alcohol use    Denies CAGE questions. Advised against increased amt.      CHF (congestive heart failure)    Followed by cardiology.       Encounter for therapeutic drug monitoring   Relevant Orders   Protime-INR   GERD (gastroesophageal reflux disease)    With increased stress, pt has restarted prilosec OTC      Relevant Medications   omeprazole (PRILOSEC) 10 MG capsule   Healthcare maintenance - Primary    Preventative protocols reviewed and updated unless pt declined. Discussed healthy diet and lifestyle.       Hyperlipidemia    Chronic, stable. Continue statin. LDL at goal 68.      Hypertension    bp well controlled today. Continue chronic current regimen.      Long term current use of anticoagulant therapy    Hypercoagulable by POCT INR 8. Check STAT PT/INR blood draw and will forward to coumadin clinic.      OSA (obstructive sleep apnea)    Compliant with CPAP  Follow up plan: Return in about 1 year (around 03/10/2016), or as needed, for annual exam, prior fasting for blood work.

## 2015-03-11 NOTE — Assessment & Plan Note (Signed)
Preventative protocols reviewed and updated unless pt declined. Discussed healthy diet and lifestyle.  

## 2015-03-11 NOTE — Assessment & Plan Note (Signed)
With increased stress, pt has restarted prilosec OTC

## 2015-03-11 NOTE — Assessment & Plan Note (Signed)
Denies CAGE questions. Advised against increased amt.

## 2015-03-11 NOTE — Assessment & Plan Note (Signed)
Chronic, stable. Continue statin. LDL at goal 68.

## 2015-03-11 NOTE — Telephone Encounter (Signed)
Noted. Will route to coumadin clinic.

## 2015-03-11 NOTE — Assessment & Plan Note (Signed)
Hypercoagulable by POCT INR 8. Check STAT PT/INR blood draw and will forward to coumadin clinic.

## 2015-03-11 NOTE — Assessment & Plan Note (Addendum)
bp well controlled today. Continue chronic current regimen.

## 2015-03-11 NOTE — Assessment & Plan Note (Signed)
Followed by cardiology 

## 2015-03-11 NOTE — Patient Instructions (Addendum)
Good to see you today. You are doing well.  Increase aerobic exercise to improve good cholesterol levels.  Watch alcohol.  Check PT/INR again today - expect call from coumadin clinic if you don't hear from them by this afternoon call them. Return as needed or in 1 year for next wellness visit.

## 2015-03-11 NOTE — Progress Notes (Signed)
Pre visit review using our clinic review tool, if applicable. No additional management support is needed unless otherwise documented below in the visit note. 

## 2015-03-11 NOTE — Assessment & Plan Note (Signed)
Compliant with CPAP 

## 2015-03-19 ENCOUNTER — Ambulatory Visit (INDEPENDENT_AMBULATORY_CARE_PROVIDER_SITE_OTHER): Payer: BLUE CROSS/BLUE SHIELD | Admitting: *Deleted

## 2015-03-19 DIAGNOSIS — Z952 Presence of prosthetic heart valve: Secondary | ICD-10-CM

## 2015-03-19 DIAGNOSIS — Z7901 Long term (current) use of anticoagulants: Secondary | ICD-10-CM

## 2015-03-19 DIAGNOSIS — Z5181 Encounter for therapeutic drug level monitoring: Secondary | ICD-10-CM | POA: Diagnosis not present

## 2015-03-19 DIAGNOSIS — I4891 Unspecified atrial fibrillation: Secondary | ICD-10-CM | POA: Diagnosis not present

## 2015-03-19 DIAGNOSIS — Z954 Presence of other heart-valve replacement: Secondary | ICD-10-CM | POA: Diagnosis not present

## 2015-03-19 LAB — POCT INR: INR: 3

## 2015-04-03 ENCOUNTER — Other Ambulatory Visit: Payer: Self-pay | Admitting: Cardiology

## 2015-04-09 ENCOUNTER — Ambulatory Visit (INDEPENDENT_AMBULATORY_CARE_PROVIDER_SITE_OTHER): Payer: BLUE CROSS/BLUE SHIELD

## 2015-04-09 DIAGNOSIS — I4891 Unspecified atrial fibrillation: Secondary | ICD-10-CM

## 2015-04-09 DIAGNOSIS — Z5181 Encounter for therapeutic drug level monitoring: Secondary | ICD-10-CM | POA: Diagnosis not present

## 2015-04-09 DIAGNOSIS — Z7901 Long term (current) use of anticoagulants: Secondary | ICD-10-CM | POA: Diagnosis not present

## 2015-04-09 DIAGNOSIS — Z952 Presence of prosthetic heart valve: Secondary | ICD-10-CM

## 2015-04-09 DIAGNOSIS — Z954 Presence of other heart-valve replacement: Secondary | ICD-10-CM | POA: Diagnosis not present

## 2015-04-09 LAB — POCT INR: INR: 3.8

## 2015-04-29 ENCOUNTER — Ambulatory Visit: Payer: BC Managed Care – PPO | Admitting: Pulmonary Disease

## 2015-05-06 ENCOUNTER — Ambulatory Visit (INDEPENDENT_AMBULATORY_CARE_PROVIDER_SITE_OTHER): Payer: BLUE CROSS/BLUE SHIELD | Admitting: *Deleted

## 2015-05-06 DIAGNOSIS — Z954 Presence of other heart-valve replacement: Secondary | ICD-10-CM

## 2015-05-06 DIAGNOSIS — Z5181 Encounter for therapeutic drug level monitoring: Secondary | ICD-10-CM

## 2015-05-06 DIAGNOSIS — Z7901 Long term (current) use of anticoagulants: Secondary | ICD-10-CM

## 2015-05-06 DIAGNOSIS — I4891 Unspecified atrial fibrillation: Secondary | ICD-10-CM | POA: Diagnosis not present

## 2015-05-06 DIAGNOSIS — Z952 Presence of prosthetic heart valve: Secondary | ICD-10-CM

## 2015-05-06 LAB — POCT INR: INR: 3.5

## 2015-05-11 ENCOUNTER — Ambulatory Visit (INDEPENDENT_AMBULATORY_CARE_PROVIDER_SITE_OTHER): Payer: BLUE CROSS/BLUE SHIELD | Admitting: Internal Medicine

## 2015-05-11 ENCOUNTER — Encounter: Payer: Self-pay | Admitting: Internal Medicine

## 2015-05-11 ENCOUNTER — Ambulatory Visit: Payer: BLUE CROSS/BLUE SHIELD | Admitting: Primary Care

## 2015-05-11 VITALS — BP 120/78 | HR 76 | Temp 98.4°F | Wt 195.0 lb

## 2015-05-11 DIAGNOSIS — M10072 Idiopathic gout, left ankle and foot: Secondary | ICD-10-CM

## 2015-05-11 DIAGNOSIS — M109 Gout, unspecified: Secondary | ICD-10-CM

## 2015-05-11 MED ORDER — COLCHICINE 0.6 MG PO TABS: ORAL_TABLET | ORAL | Status: DC

## 2015-05-11 NOTE — Progress Notes (Signed)
Pre visit review using our clinic review tool, if applicable. No additional management support is needed unless otherwise documented below in the visit note. 

## 2015-05-11 NOTE — Patient Instructions (Signed)

## 2015-05-11 NOTE — Progress Notes (Signed)
Subjective:    Patient ID: Ian Bowen, male    DOB: August 22, 1951, 64 y.o.   MRN: [id number]  HPI  Pt presents to the clinic today with c/o pain in his left big toe, ankle and the ball of his foot. This started 1 week ago. He describes the pain as sore and stiff. He has noticed some swelling. It hurts so bad that he does not want anything to touch it. He has not taken anything OTC because he is on Coumadin. He has kept his foot elevated and increased his water intake but is not sure that it helped. He denies any injury to the area. He denies changes in his diet. He has consumed some red meat and he does drink alcohol. He has never had gout in the past.  Review of Systems      Past Medical History  Diagnosis Date  . S/P MVR (mitral valve replacement)     SEPT  2002   [place]   [place]  . HTN (hypertension)   . GERD (gastroesophageal reflux disease)   . ED (erectile dysfunction)   . Cardiomyopathy     per echo  05-07-2013   ef  50%  . Atrial fibrillation, permanent   . Hyperlipidemia   . CHF (congestive heart failure)     monitored by dr Jens Som--  euvolemic  . OSA on CPAP 2013    moderate OSA PER STUDY  09-02-2012  W/ DR CLANCE  . Prolapsed internal hemorrhoids, grade 3     Current Outpatient Prescriptions  Medication Sig Dispense Refill  . acetaminophen (TYLENOL) 500 MG tablet Take 1,000 mg by mouth every 6 (six) hours as needed for moderate pain.    Marland Kitchen aspirin 81 MG tablet Take 81 mg by mouth at bedtime.     . furosemide (LASIX) 20 MG tablet Take 1 tablet (20 mg total) by mouth daily. 90 tablet 3  . lisinopril (PRINIVIL,ZESTRIL) 5 MG tablet Take 1 tablet (5 mg total) by mouth daily. 90 tablet 4  . lovastatin (MEVACOR) 20 MG tablet Take 1 tablet (20 mg total) by mouth daily at 6 PM. 90 tablet 4  . metoprolol succinate (TOPROL-XL) 100 MG 24 hr tablet Take 1 tablet (100 mg total) by mouth 2 (two) times daily. Take with or immediately following a meal. 180 tablet 3  . omeprazole  (PRILOSEC) 10 MG capsule Take 10 mg by mouth daily.    . tadalafil (CIALIS) 5 MG tablet Take this medication as directed by your pharmacy. No NTG within 24 hours of use. 90 tablet 1  . warfarin (COUMADIN) 5 MG tablet TAKE AS DIRECTED BY        ANTICOAGULATION CLINIC 120 tablet 1   No current facility-administered medications for this visit.    No Known Allergies  Family History  Problem Relation Age of Onset  . Heart attack Father     died of MI at age 64  . Coronary artery disease Father   . Cancer Mother 4    spleen cancer  . Coronary artery disease Paternal Uncle   . Coronary artery disease Paternal Grandfather   . Stroke Neg Hx   . Diabetes Neg Hx     History   Social History  . Marital Status: Single    Spouse Name: N/A  . Number of Children: 2  . Years of Education: N/A   Occupational History  .  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 Smoking status: Never Smoker   .  Smokeless tobacco: Never Used  . Alcohol Use: 6.0 oz/week    4 Glasses of wine, 6 Cans of beer per week     Comment: previously wine/beer (bottle wine nightly).  stopped with results of echo- restarted.  . Drug Use: No  . Sexual Activity: Not on file   Other Topics Concern  . Not on file   Social History Narrative   Caffeine: 2 cups soda/day   Lives alone, no pets, s/p 2 divorces   Occupation: Regulatory affairs officer for [employer]   Activity: stays active at work, no regular exercise   Diet: some salads, trying to eat more healthy     Constitutional: Denies fever, malaise, fatigue, headache or abrupt weight changes.  Respiratory: Denies difficulty breathing, shortness of breath, cough or sputum production.   Cardiovascular: Denies chest pain, chest tightness, palpitations or swelling in the hands or feet.  Musculoskeletal: Pt reports left foot/ankle pain. Denies difficulty with gait.  Skin: Pt reports redness. Denies rashes, lesions or ulcercations.  Neurological: Denies dizziness, difficulty with  memory, difficulty with speech or problems with balance and coordination.   No other specific complaints in a complete review of systems (except as listed in HPI above).  Objective:   Physical Exam  BP 120/78 mmHg  Pulse 76  Temp(Src) 98.4 F (36.9 C) (Oral)  Wt 195 lb (88.451 kg)  SpO2 97% Wt Readings from Last 3 Encounters:  05/11/15 195 lb (88.451 kg)  03/11/15 194 lb 8 oz (88.225 kg)  02/17/15 194 lb 12.8 oz (88.361 kg)    General: Appears his stated age, well developed, well nourished in NAD. Skin: Warm, dry and intact. Redness noted of left great toe and dorsal surface of foot. Cardiovascular: Normal rate and rhythm. S1,S2 noted.  No murmur, rubs or gallops noted.  Pulmonary/Chest: Normal effort and positive vesicular breath sounds. No respiratory distress. No wheezes, rales or ronchi noted.  Musculoskeletal: Decreased flexion and extension of great toe (but this is his norm). Pain and swelling in the left MTP joint. Decreased, flexion, and rotation of the left ankle. Neurological: Alert and oriented. Sensation intact to BLE.   BMET    Component Value Date/Time   NA 139 03/04/2015 0858   NA 141 03/31/2011   K 3.9 03/04/2015 0858   K 4.4 03/31/2011   CL 105 03/04/2015 0858   CO2 25 03/04/2015 0858   GLUCOSE 90 03/04/2015 0858   BUN 13 03/04/2015 0858   BUN 12 03/31/2011   CREATININE 0.94 03/04/2015 0858   CREATININE 0.88 03/31/2011   CALCIUM 9.1 03/04/2015 0858   GFRNONAA >90 01/11/2014 0445   GFRAA >90 01/11/2014 0445    Lipid Panel     Component Value Date/Time   CHOL 125 03/04/2015 0858   CHOL 179 03/31/2011   CHOL 179 03/31/2011   TRIG 95.0 03/04/2015 0858   TRIG 104 03/31/2011   HDL 38.00* 03/04/2015 0858   CHOLHDL 3 03/04/2015 0858   VLDL 19.0 03/04/2015 0858   LDLCALC 68 03/04/2015 0858    CBC    Component Value Date/Time   WBC 5.8 03/25/2014 1217   RBC 4.20* 03/25/2014 1217   HGB 13.8 03/25/2014 1217   HCT 40.9 03/25/2014 1217   PLT  249.0 03/25/2014 1217   MCV 97.5 03/25/2014 1217   MCH 32.3 01/11/2014 0445   MCHC 33.7 03/25/2014 1217   RDW 14.5 03/25/2014 1217   LYMPHSABS 1.3 03/25/2014 1217   MONOABS 0.5 03/25/2014 1217   EOSABS 0.6 03/25/2014 1217  BASOSABS 0.1 03/25/2014 1217    Hgb A1C No results found for: HGBA1C       Assessment & Plan:   Acute gout of left foot:  Will treat with colchicine, eRx sent Will check uric acid level at next visit  RTC as needed or if symptoms persist or worsen

## 2015-05-20 ENCOUNTER — Ambulatory Visit (INDEPENDENT_AMBULATORY_CARE_PROVIDER_SITE_OTHER): Payer: BLUE CROSS/BLUE SHIELD | Admitting: Pulmonary Disease

## 2015-05-20 ENCOUNTER — Encounter: Payer: Self-pay | Admitting: Pulmonary Disease

## 2015-05-20 VITALS — BP 115/78 | HR 69 | Ht 70.5 in | Wt 198.8 lb

## 2015-05-20 DIAGNOSIS — G4733 Obstructive sleep apnea (adult) (pediatric): Secondary | ICD-10-CM | POA: Diagnosis not present

## 2015-05-20 NOTE — Patient Instructions (Signed)
CPAP supplies will be renewed x 1 year Stay on auto settings- avg pr is 14 cm

## 2015-05-20 NOTE — Assessment & Plan Note (Signed)
CPAP supplies will be renewed x 1 year Stay on auto settings- avg pr is 14 cm  Weight loss encouraged, compliance with goal of at least 4-6 hrs every night is the expectation. Advised against medications with sedative side effects Cautioned against driving when sleepy - understanding that sleepiness will vary on a day to day basis

## 2015-05-20 NOTE — Progress Notes (Signed)
   Subjective:    Patient ID: Ian Bowen, male    DOB: 08/19/1951, 64 y.o.   MRN: [id number]  HPI   Chief Complaint  Patient presents with  . Sleep Apnea    Wears CPAP every night when in town, when out of town wears mouthpiece.  does not need supplies.     He has AF , sp MVR 2002 He wears CPAP during the week, and uses a dental appliance on weekends when he travels to [place]. He feels that he has been doing very well with this, and is satisfied with his sleep and daytime alertness Mask ok , pr ok, has received supplies on time Wt has remained stable download 08/2014 >> about 6 events/hr on average Download 05/2015 >>AHI 4.7/h, good usage, avg pr 14 cm  Significant tests/ events  NPSG 08/2012:  AHI 30/hr   Past Medical History  Diagnosis Date  . S/P MVR (mitral valve replacement)     SEPT  2002   IN   [place]  . HTN (hypertension)   . GERD (gastroesophageal reflux disease)   . ED (erectile dysfunction)   . Cardiomyopathy     per echo  05-07-2013   ef  50%  . Atrial fibrillation, permanent   . Hyperlipidemia   . CHF (congestive heart failure)     monitored by dr Jens Som--  euvolemic  . OSA on CPAP 2013    moderate OSA PER STUDY  09-02-2012  W/ DR CLANCE  . Prolapsed internal hemorrhoids, grade 3     Review of Systems neg for any significant sore throat, dysphagia, itching, sneezing, nasal congestion or excess/ purulent secretions, fever, chills, sweats, unintended wt loss, pleuritic or exertional cp, hempoptysis, orthopnea pnd or change in chronic leg swelling. Also denies presyncope, palpitations, heartburn, abdominal pain, nausea, vomiting, diarrhea or change in bowel or urinary habits, dysuria,hematuria, rash, arthralgias, visual complaints, headache, numbness weakness or ataxia.     Objective:   Physical Exam  Gen. Pleasant, well-nourished, in no distress ENT - no lesions, no post nasal drip Neck: No JVD, no thyromegaly, no carotid bruits Lungs: no use of  accessory muscles, no dullness to percussion, clear without rales or rhonchi  Cardiovascular: Rhythm regular, heart sounds  normal, no murmurs or gallops, no peripheral edema Musculoskeletal: No deformities, no cyanosis or clubbing        Assessment & Plan:

## 2015-05-27 ENCOUNTER — Other Ambulatory Visit: Payer: Self-pay | Admitting: *Deleted

## 2015-05-27 ENCOUNTER — Telehealth: Payer: Self-pay | Admitting: Cardiology

## 2015-05-27 ENCOUNTER — Other Ambulatory Visit: Payer: Self-pay

## 2015-05-27 DIAGNOSIS — I429 Cardiomyopathy, unspecified: Secondary | ICD-10-CM

## 2015-05-27 DIAGNOSIS — I4891 Unspecified atrial fibrillation: Secondary | ICD-10-CM

## 2015-05-27 DIAGNOSIS — E785 Hyperlipidemia, unspecified: Secondary | ICD-10-CM

## 2015-05-27 DIAGNOSIS — Z952 Presence of prosthetic heart valve: Secondary | ICD-10-CM

## 2015-05-27 MED ORDER — LISINOPRIL 5 MG PO TABS: 5.0000 mg | ORAL_TABLET | Freq: Every day | ORAL | Status: DC

## 2015-05-27 MED ORDER — METOPROLOL SUCCINATE ER 100 MG PO TB24: 100.0000 mg | ORAL_TABLET | Freq: Two times a day (BID) | ORAL | Status: DC

## 2015-05-27 MED ORDER — FUROSEMIDE 20 MG PO TABS: 20.0000 mg | ORAL_TABLET | Freq: Every day | ORAL | Status: DC

## 2015-05-27 MED ORDER — LOVASTATIN 20 MG PO TABS: 20.0000 mg | ORAL_TABLET | Freq: Every day | ORAL | Status: DC

## 2015-05-27 MED ORDER — WARFARIN SODIUM 5 MG PO TABS: ORAL_TABLET | ORAL | Status: DC

## 2015-05-27 MED ORDER — OMEPRAZOLE 10 MG PO CPDR: 10.0000 mg | DELAYED_RELEASE_CAPSULE | Freq: Every day | ORAL | Status: DC

## 2015-05-27 NOTE — Telephone Encounter (Signed)
Coumadin refill request sent, please refer to medication refill.

## 2015-05-27 NOTE — Telephone Encounter (Deleted)
ERROR

## 2015-05-27 NOTE — Telephone Encounter (Signed)
Please refill Coumadin. (Send to [facility]) Thanks.

## 2015-06-08 ENCOUNTER — Encounter: Payer: Self-pay | Admitting: Pulmonary Disease

## 2015-06-10 ENCOUNTER — Ambulatory Visit (INDEPENDENT_AMBULATORY_CARE_PROVIDER_SITE_OTHER): Payer: BLUE CROSS/BLUE SHIELD | Admitting: *Deleted

## 2015-06-10 DIAGNOSIS — I4891 Unspecified atrial fibrillation: Secondary | ICD-10-CM | POA: Diagnosis not present

## 2015-06-10 DIAGNOSIS — Z5181 Encounter for therapeutic drug level monitoring: Secondary | ICD-10-CM

## 2015-06-10 DIAGNOSIS — Z954 Presence of other heart-valve replacement: Secondary | ICD-10-CM

## 2015-06-10 DIAGNOSIS — Z7901 Long term (current) use of anticoagulants: Secondary | ICD-10-CM | POA: Diagnosis not present

## 2015-06-10 DIAGNOSIS — Z952 Presence of prosthetic heart valve: Secondary | ICD-10-CM

## 2015-06-10 LAB — POCT INR: INR: 3.9

## 2015-06-26 NOTE — Telephone Encounter (Signed)
ERROR

## 2015-07-02 ENCOUNTER — Ambulatory Visit (INDEPENDENT_AMBULATORY_CARE_PROVIDER_SITE_OTHER): Payer: BLUE CROSS/BLUE SHIELD | Admitting: *Deleted

## 2015-07-02 DIAGNOSIS — Z952 Presence of prosthetic heart valve: Secondary | ICD-10-CM

## 2015-07-02 DIAGNOSIS — Z5181 Encounter for therapeutic drug level monitoring: Secondary | ICD-10-CM

## 2015-07-02 DIAGNOSIS — Z954 Presence of other heart-valve replacement: Secondary | ICD-10-CM | POA: Diagnosis not present

## 2015-07-02 DIAGNOSIS — Z7901 Long term (current) use of anticoagulants: Secondary | ICD-10-CM | POA: Diagnosis not present

## 2015-07-02 DIAGNOSIS — I4891 Unspecified atrial fibrillation: Secondary | ICD-10-CM

## 2015-07-02 LAB — POCT INR: INR: 3.6

## 2015-07-28 ENCOUNTER — Telehealth: Payer: Self-pay | Admitting: Family Medicine

## 2015-07-28 NOTE — Telephone Encounter (Signed)
Pt dropped off [employer] Form to be completed. Please call when done.  Form on Kim's desk.  Thanks

## 2015-07-29 ENCOUNTER — Ambulatory Visit (INDEPENDENT_AMBULATORY_CARE_PROVIDER_SITE_OTHER): Payer: BLUE CROSS/BLUE SHIELD | Admitting: *Deleted

## 2015-07-29 DIAGNOSIS — I4891 Unspecified atrial fibrillation: Secondary | ICD-10-CM | POA: Diagnosis not present

## 2015-07-29 DIAGNOSIS — Z5181 Encounter for therapeutic drug level monitoring: Secondary | ICD-10-CM

## 2015-07-29 DIAGNOSIS — Z954 Presence of other heart-valve replacement: Secondary | ICD-10-CM

## 2015-07-29 DIAGNOSIS — Z952 Presence of prosthetic heart valve: Secondary | ICD-10-CM

## 2015-07-29 DIAGNOSIS — Z7901 Long term (current) use of anticoagulants: Secondary | ICD-10-CM | POA: Diagnosis not present

## 2015-07-29 LAB — POCT INR: INR: 5.1

## 2015-07-29 NOTE — Telephone Encounter (Signed)
Spoke with patient. Waist measured and form completed. Placed up front for pick up.

## 2015-07-29 NOTE — Telephone Encounter (Signed)
Signed and in Kim's box. 

## 2015-07-29 NOTE — Telephone Encounter (Signed)
In your IN box for completion. Will need waist for circum.

## 2015-08-20 ENCOUNTER — Ambulatory Visit (INDEPENDENT_AMBULATORY_CARE_PROVIDER_SITE_OTHER): Payer: BLUE CROSS/BLUE SHIELD

## 2015-08-20 DIAGNOSIS — Z954 Presence of other heart-valve replacement: Secondary | ICD-10-CM | POA: Diagnosis not present

## 2015-08-20 DIAGNOSIS — Z5181 Encounter for therapeutic drug level monitoring: Secondary | ICD-10-CM

## 2015-08-20 DIAGNOSIS — Z7901 Long term (current) use of anticoagulants: Secondary | ICD-10-CM

## 2015-08-20 DIAGNOSIS — I4891 Unspecified atrial fibrillation: Secondary | ICD-10-CM | POA: Diagnosis not present

## 2015-08-20 DIAGNOSIS — Z952 Presence of prosthetic heart valve: Secondary | ICD-10-CM

## 2015-08-20 LAB — POCT INR: INR: 4.2

## 2015-09-17 ENCOUNTER — Ambulatory Visit (INDEPENDENT_AMBULATORY_CARE_PROVIDER_SITE_OTHER): Payer: BLUE CROSS/BLUE SHIELD | Admitting: *Deleted

## 2015-09-17 DIAGNOSIS — Z5181 Encounter for therapeutic drug level monitoring: Secondary | ICD-10-CM

## 2015-09-17 DIAGNOSIS — I4891 Unspecified atrial fibrillation: Secondary | ICD-10-CM | POA: Diagnosis not present

## 2015-09-17 DIAGNOSIS — Z7901 Long term (current) use of anticoagulants: Secondary | ICD-10-CM | POA: Diagnosis not present

## 2015-09-17 DIAGNOSIS — Z952 Presence of prosthetic heart valve: Secondary | ICD-10-CM

## 2015-09-17 DIAGNOSIS — Z954 Presence of other heart-valve replacement: Secondary | ICD-10-CM

## 2015-09-17 LAB — POCT INR: INR: 2.4

## 2015-10-07 ENCOUNTER — Other Ambulatory Visit: Payer: Self-pay | Admitting: Cardiology

## 2015-10-30 ENCOUNTER — Ambulatory Visit (INDEPENDENT_AMBULATORY_CARE_PROVIDER_SITE_OTHER): Payer: BLUE CROSS/BLUE SHIELD | Admitting: *Deleted

## 2015-10-30 DIAGNOSIS — I4891 Unspecified atrial fibrillation: Secondary | ICD-10-CM

## 2015-10-30 DIAGNOSIS — Z5181 Encounter for therapeutic drug level monitoring: Secondary | ICD-10-CM | POA: Diagnosis not present

## 2015-10-30 DIAGNOSIS — Z952 Presence of prosthetic heart valve: Secondary | ICD-10-CM

## 2015-10-30 DIAGNOSIS — Z954 Presence of other heart-valve replacement: Secondary | ICD-10-CM | POA: Diagnosis not present

## 2015-10-30 DIAGNOSIS — Z7901 Long term (current) use of anticoagulants: Secondary | ICD-10-CM

## 2015-10-30 LAB — POCT INR: INR: 5.9

## 2015-11-05 ENCOUNTER — Ambulatory Visit (INDEPENDENT_AMBULATORY_CARE_PROVIDER_SITE_OTHER): Payer: BLUE CROSS/BLUE SHIELD | Admitting: *Deleted

## 2015-11-05 DIAGNOSIS — Z952 Presence of prosthetic heart valve: Secondary | ICD-10-CM

## 2015-11-05 DIAGNOSIS — Z954 Presence of other heart-valve replacement: Secondary | ICD-10-CM | POA: Diagnosis not present

## 2015-11-05 DIAGNOSIS — I4891 Unspecified atrial fibrillation: Secondary | ICD-10-CM

## 2015-11-05 DIAGNOSIS — Z5181 Encounter for therapeutic drug level monitoring: Secondary | ICD-10-CM | POA: Diagnosis not present

## 2015-11-05 DIAGNOSIS — Z7901 Long term (current) use of anticoagulants: Secondary | ICD-10-CM

## 2015-11-05 LAB — POCT INR: INR: 4.4

## 2015-11-16 ENCOUNTER — Telehealth: Payer: Self-pay | Admitting: Cardiology

## 2015-11-16 ENCOUNTER — Ambulatory Visit (INDEPENDENT_AMBULATORY_CARE_PROVIDER_SITE_OTHER): Payer: BLUE CROSS/BLUE SHIELD | Admitting: *Deleted

## 2015-11-16 DIAGNOSIS — I4891 Unspecified atrial fibrillation: Secondary | ICD-10-CM

## 2015-11-16 DIAGNOSIS — Z954 Presence of other heart-valve replacement: Secondary | ICD-10-CM | POA: Diagnosis not present

## 2015-11-16 DIAGNOSIS — Z5181 Encounter for therapeutic drug level monitoring: Secondary | ICD-10-CM

## 2015-11-16 DIAGNOSIS — Z7901 Long term (current) use of anticoagulants: Secondary | ICD-10-CM

## 2015-11-16 DIAGNOSIS — Z952 Presence of prosthetic heart valve: Secondary | ICD-10-CM

## 2015-11-16 LAB — PROTIME-INR
INR: 5.99 — AB (ref 0.00–1.49)
PROTHROMBIN TIME: 51.5 s — AB (ref 11.6–15.2)

## 2015-11-16 LAB — POCT INR: INR: 8

## 2015-11-16 NOTE — Telephone Encounter (Signed)
Please call asap

## 2015-11-16 NOTE — Telephone Encounter (Signed)
Pt had duplicate INR orders from two different MD's and they want to make sure of how to proceed. Verified that they need to be done as this is follow up from elevated INR.

## 2015-11-16 NOTE — Telephone Encounter (Signed)
Left message to call back  

## 2015-11-24 ENCOUNTER — Ambulatory Visit (INDEPENDENT_AMBULATORY_CARE_PROVIDER_SITE_OTHER): Payer: BLUE CROSS/BLUE SHIELD

## 2015-11-24 DIAGNOSIS — I4891 Unspecified atrial fibrillation: Secondary | ICD-10-CM

## 2015-11-24 DIAGNOSIS — Z954 Presence of other heart-valve replacement: Secondary | ICD-10-CM | POA: Diagnosis not present

## 2015-11-24 DIAGNOSIS — Z5181 Encounter for therapeutic drug level monitoring: Secondary | ICD-10-CM | POA: Diagnosis not present

## 2015-11-24 DIAGNOSIS — Z952 Presence of prosthetic heart valve: Secondary | ICD-10-CM

## 2015-11-24 DIAGNOSIS — Z7901 Long term (current) use of anticoagulants: Secondary | ICD-10-CM | POA: Diagnosis not present

## 2015-11-24 LAB — POCT INR: INR: 3.1

## 2015-12-08 ENCOUNTER — Ambulatory Visit (INDEPENDENT_AMBULATORY_CARE_PROVIDER_SITE_OTHER): Payer: BLUE CROSS/BLUE SHIELD

## 2015-12-08 DIAGNOSIS — Z954 Presence of other heart-valve replacement: Secondary | ICD-10-CM

## 2015-12-08 DIAGNOSIS — Z7901 Long term (current) use of anticoagulants: Secondary | ICD-10-CM

## 2015-12-08 DIAGNOSIS — Z5181 Encounter for therapeutic drug level monitoring: Secondary | ICD-10-CM

## 2015-12-08 DIAGNOSIS — I4891 Unspecified atrial fibrillation: Secondary | ICD-10-CM

## 2015-12-08 DIAGNOSIS — Z952 Presence of prosthetic heart valve: Secondary | ICD-10-CM

## 2015-12-08 LAB — POCT INR: INR: 4.8

## 2015-12-28 ENCOUNTER — Telehealth: Payer: Self-pay

## 2015-12-28 ENCOUNTER — Ambulatory Visit (INDEPENDENT_AMBULATORY_CARE_PROVIDER_SITE_OTHER): Payer: BLUE CROSS/BLUE SHIELD | Admitting: Primary Care

## 2015-12-28 ENCOUNTER — Encounter: Payer: Self-pay | Admitting: Primary Care

## 2015-12-28 VITALS — BP 124/76 | HR 62 | Temp 97.9°F | Ht 70.0 in | Wt 197.4 lb

## 2015-12-28 DIAGNOSIS — M109 Gout, unspecified: Secondary | ICD-10-CM

## 2015-12-28 DIAGNOSIS — M10072 Idiopathic gout, left ankle and foot: Secondary | ICD-10-CM

## 2015-12-28 MED ORDER — COLCHICINE 0.6 MG PO CAPS: 1.0000 | ORAL_CAPSULE | Freq: Two times a day (BID) | ORAL | Status: DC

## 2015-12-28 MED ORDER — COLCHICINE 0.6 MG PO TABS: 0.6000 mg | ORAL_TABLET | Freq: Two times a day (BID) | ORAL | Status: DC

## 2015-12-28 NOTE — Progress Notes (Signed)
Pre visit review using our clinic review tool, if applicable. No additional management support is needed unless otherwise documented below in the visit note. 

## 2015-12-28 NOTE — Patient Instructions (Signed)
Start Colchicine 0.6 mg tablets for gout flare. Take 1 tablet by mouth twice daily until symptoms resolve.  Please schedule a lab only appointment in 2-3 weeks to test your gout levels. This test may only be done after your symptoms have resolved. If the lab test is elevated, we will need to consider placing you on medication to prevent gout flares.  Please notify me if no improvement in your symptoms.   It was a pleasure meeting you!  Gout Gout is an inflammatory arthritis caused by a buildup of uric acid crystals in the joints. Uric acid is a chemical that is normally present in the blood. When the level of uric acid in the blood is too high it can form crystals that deposit in your joints and tissues. This causes joint redness, soreness, and swelling (inflammation). Repeat attacks are common. Over time, uric acid crystals can form into masses (tophi) near a joint, destroying bone and causing disfigurement. Gout is treatable and often preventable. CAUSES  The disease begins with elevated levels of uric acid in the blood. Uric acid is produced by your body when it breaks down a naturally found substance called purines. Certain foods you eat, such as meats and fish, contain high amounts of purines. Causes of an elevated uric acid level include:  Being passed down from parent to child (heredity).  Diseases that cause increased uric acid production (such as obesity, psoriasis, and certain cancers).  Excessive alcohol use.  Diet, especially diets rich in meat and seafood.  Medicines, including certain cancer-fighting medicines (chemotherapy), water pills (diuretics), and aspirin.  Chronic kidney disease. The kidneys are no longer able to remove uric acid well.  Problems with metabolism. Conditions strongly associated with gout include:  Obesity.  High blood pressure.  High cholesterol.  Diabetes. Not everyone with elevated uric acid levels gets gout. It is not understood why some  people get gout and others do not. Surgery, joint injury, and eating too much of certain foods are some of the factors that can lead to gout attacks. SYMPTOMS   An attack of gout comes on quickly. It causes intense pain with redness, swelling, and warmth in a joint.  Fever can occur.  Often, only one joint is involved. Certain joints are more commonly involved:  Base of the big toe.  Knee.  Ankle.  Wrist.  Finger. Without treatment, an attack usually goes away in a few days to weeks. Between attacks, you usually will not have symptoms, which is different from many other forms of arthritis. DIAGNOSIS  Your caregiver will suspect gout based on your symptoms and exam. In some cases, tests may be recommended. The tests may include:  Blood tests.  Urine tests.  X-rays.  Joint fluid exam. This exam requires a needle to remove fluid from the joint (arthrocentesis). Using a microscope, gout is confirmed when uric acid crystals are seen in the joint fluid. TREATMENT  There are two phases to gout treatment: treating the sudden onset (acute) attack and preventing attacks (prophylaxis).  Treatment of an Acute Attack.  Medicines are used. These include anti-inflammatory medicines or steroid medicines.  An injection of steroid medicine into the affected joint is sometimes necessary.  The painful joint is rested. Movement can worsen the arthritis.  You may use warm or cold treatments on painful joints, depending which works best for you.  Treatment to Prevent Attacks.  If you suffer from frequent gout attacks, your caregiver may advise preventive medicine. These medicines are started after  the acute attack subsides. These medicines either help your kidneys eliminate uric acid from your body or decrease your uric acid production. You may need to stay on these medicines for a very long time.  The early phase of treatment with preventive medicine can be associated with an increase in  acute gout attacks. For this reason, during the first few months of treatment, your caregiver may also advise you to take medicines usually used for acute gout treatment. Be sure you understand your caregiver's directions. Your caregiver may make several adjustments to your medicine dose before these medicines are effective.  Discuss dietary treatment with your caregiver or dietitian. Alcohol and drinks high in sugar and fructose and foods such as meat, poultry, and seafood can increase uric acid levels. Your caregiver or dietitian can advise you on drinks and foods that should be limited. HOME CARE INSTRUCTIONS   Do not take aspirin to relieve pain. This raises uric acid levels.  Only take over-the-counter or prescription medicines for pain, discomfort, or fever as directed by your caregiver.  Rest the joint as much as possible. When in bed, keep sheets and blankets off painful areas.  Keep the affected joint raised (elevated).  Apply warm or cold treatments to painful joints. Use of warm or cold treatments depends on which works best for you.  Use crutches if the painful joint is in your leg.  Drink enough fluids to keep your urine clear or pale yellow. This helps your body get rid of uric acid. Limit alcohol, sugary drinks, and fructose drinks.  Follow your dietary instructions. Pay careful attention to the amount of protein you eat. Your daily diet should emphasize fruits, vegetables, whole grains, and fat-free or low-fat milk products. Discuss the use of coffee, vitamin C, and cherries with your caregiver or dietitian. These may be helpful in lowering uric acid levels.  Maintain a healthy body weight. SEEK MEDICAL CARE IF:   You develop diarrhea, vomiting, or any side effects from medicines.  You do not feel better in 24 hours, or you are getting worse. SEEK IMMEDIATE MEDICAL CARE IF:   Your joint becomes suddenly more tender, and you have chills or a fever. MAKE SURE YOU:    Understand these instructions.  Will watch your condition.  Will get help right away if you are not doing well or get worse.   This information is not intended to replace advice given to you by your health care provider. Make sure you discuss any questions you have with your health care provider.   Document Released: 09/30/2000 Document Revised: 10/24/2014 Document Reviewed: 05/16/2012 Elsevier Interactive Patient Education [employer]

## 2015-12-28 NOTE — Addendum Note (Signed)
Addended by: Tawnya Crook on: 12/28/2015 04:14 PM   Modules accepted: Orders

## 2015-12-28 NOTE — Telephone Encounter (Signed)
Rx sent to pharmacy as brand.

## 2015-12-28 NOTE — Progress Notes (Signed)
Subjective:    Patient ID: Ian Bowen, male    DOB: Nov 27, 1950, 65 y.o.   MRN: [id number]  HPI  Ian Bowen is a 65 year old male who presents today with a chief complaint of foot pain. He also reports swelling. He has a history of Gout and is managed on colchicine 0.6 mg. His pain and swelling are located to the dorsal and left lateral side of his left foot, and have been present for 1 week. Denies recent injuries/trauma, fevers, fatigue. He's not changed anything in his diet recently that could cause a flare. He's been taking colchicine 1 tablet daily for the previous 4 days, and tylenol. He has noticed slight improvement with the medication but has been using this medication sparingly as he initially had few tablets to begin. He mainly presented today he is out of medication and hasn't noticed complete resolve of symptoms.   Review of Systems  Constitutional: Negative for fever, chills and fatigue.  Musculoskeletal: Positive for joint swelling and arthralgias.  Skin: Negative for color change and wound.       Past Medical History  Diagnosis Date  . S/P MVR (mitral valve replacement)     SEPT  2002   IN   [place]  . HTN (hypertension)   . GERD (gastroesophageal reflux disease)   . ED (erectile dysfunction)   . Cardiomyopathy     per echo  05-07-2013   ef  50%  . Atrial fibrillation, permanent (HCC)   . Hyperlipidemia   . CHF (congestive heart failure) (HCC)     monitored by dr Jens Som--  euvolemic  . OSA on CPAP 2013    moderate OSA PER STUDY  09-02-2012  W/ DR CLANCE  . Prolapsed internal hemorrhoids, grade 3     Social History   Social History  . Marital Status: Single    Spouse Name: N/A  . Number of Children: 2  . Years of Education: N/A   Occupational History  .  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 Smoking status: Never Smoker   . Smokeless tobacco: Never Used  . Alcohol Use: 6.0 oz/week    4 Glasses of wine, 6 Cans of beer per week     Comment: previously  wine/beer (bottle wine nightly).  stopped with results of echo- restarted.  . Drug Use: No  . Sexual Activity: Not on file   Other Topics Concern  . Not on file   Social History Narrative   Caffeine: 2 cups soda/day   Lives alone, no pets, s/p 2 divorces   Occupation: Regulatory affairs officer for [employer]   Activity: stays active at work, no regular exercise   Diet: some salads, trying to eat more healthy    Past Surgical History  Procedure Laterality Date  . Mitral valve replacement  SEPT  2002   ([facility] in [place])    [employer] bileaflet percardial valve  . Cardioversion  2006  . Right knee surgery    . 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 Retinal surx      OD  . Colonoscopy  2011    per pt report normal  . Mitral valve repair  2006   ( in [place])    secondary to paravalvular leak  . Knee arthroscopy Left   . Cardiovascular stress test  07-15-2011   DR CRENSHAW    SEVERE LV SYSTOLIC DYSFUNCTION WITH INFEROAPICAL SCAR WITH MINIMAL REVERSIBILITY/  EF 18%  . Transthoracic echocardiogram  05-07-2013    DR CRENSHAW    MILD LVH/  EF  50%/  OSCILLATING DENSITY IN LV CAVITY MOST LIKELY RESIDUAL MV APPARATUS FROM PREVIOUS MVR,  MILDLY ELEVATED MEAN GRADIENT OF 19mmHg/  SEVERE LAD/  MODERATE  RAD/  RV  MILD DILATED/   MILD   TR/  TRIVIAL  PR  . Cardiac catheterization  05-03-2001      NORMAL CORONARIES/  NORMAL LVF WITH LV MILDY DILATED/  SEVERE MITRAL INSUFFICIENCY    Family History  Problem Relation Age of Onset  . Heart attack Father     died of MI at age 33  . Coronary artery disease Father   . Cancer Mother 83    spleen cancer  . Coronary artery disease Paternal Uncle   . Coronary artery disease Paternal Grandfather   . Stroke Neg Hx   . Diabetes Neg Hx     No Known Allergies  Current Outpatient Prescriptions on File Prior to Visit  Medication Sig Dispense Refill  . acetaminophen (TYLENOL) 500 MG tablet Take 1,000 mg by mouth every 6 (six)  hours as needed for moderate pain.    Marland Kitchen aspirin 81 MG tablet Take 81 mg by mouth at bedtime.     . furosemide (LASIX) 20 MG tablet Take 1 tablet (20 mg total) by mouth daily. 90 tablet 3  . lisinopril (PRINIVIL,ZESTRIL) 5 MG tablet Take 1 tablet (5 mg total) by mouth daily. 90 tablet 4  . lovastatin (MEVACOR) 20 MG tablet Take 1 tablet (20 mg total) by mouth daily at 6 PM. 90 tablet 4  . metoprolol succinate (TOPROL-XL) 100 MG 24 hr tablet Take 1 tablet (100 mg total) by mouth 2 (two) times daily. Take with or immediately following a meal. 180 tablet 3  . omeprazole (PRILOSEC) 10 MG capsule Take 1 capsule (10 mg total) by mouth daily. 90 capsule 3  . tadalafil (CIALIS) 5 MG tablet Take this medication as directed by your pharmacy. No NTG within 24 hours of use. 90 tablet 1  . warfarin (COUMADIN) 5 MG tablet TAKE AS DIRECTED BY ANTICOAGULATION CLINIC 120 tablet 1   No current facility-administered medications on file prior to visit.    BP 124/76 mmHg  Pulse 62  Temp(Src) 97.9 F (36.6 C) (Oral)  Ht  (1.778 m)  Wt 197 lb 6.4 oz (89.54 kg)  BMI 28.32 kg/m2  SpO2 98%    Objective:   Physical Exam  Constitutional: He appears well-nourished.  Cardiovascular: Normal rate and regular rhythm.   Pulses:      Dorsalis pedis pulses are 2+ on the right side, and 2+ on the left side.       Posterior tibial pulses are 2+ on the right side, and 2+ on the left side.  Pulmonary/Chest: Effort normal and breath sounds normal.  Musculoskeletal:  Tender upon palpation to left lateral and dorsal foot. No discomfort with PROM to ankle and toes.  Skin:  Moderate swelling without erythema to left dorsal, lateral part off foot. Also noted around left malleolus          Assessment & Plan:  Acute Gout Flare:  Prior history of last summer, otherwise, do not see mention of in chart. No recent uric acid level on file. He's been using colchicine that was left over from a prior RX, but only using  once daily. He's had one dose this AM. Exam with moderate swelling to left dorsal and  lateral ankle of foot. Tender. No s/s of cellulitis as there is no entry point, erythema, fevers, warmth. Does appear to be gout. Will send new RX for colchicine and have him take twice daily. Will also obtain uric acid level in 2-3 weeks once symptoms resolve. May need to consider preventative meds if gout continues to re-occur. Last flare in November per patient. Return precautions provided.

## 2015-12-28 NOTE — Telephone Encounter (Signed)
Ian Bowen with [facility] left v/m; pts ins will not cover generic colchicine; request med to be resent as brand colchicine DAW 1.

## 2015-12-29 ENCOUNTER — Ambulatory Visit (INDEPENDENT_AMBULATORY_CARE_PROVIDER_SITE_OTHER): Payer: BLUE CROSS/BLUE SHIELD

## 2015-12-29 DIAGNOSIS — Z7901 Long term (current) use of anticoagulants: Secondary | ICD-10-CM

## 2015-12-29 DIAGNOSIS — I4891 Unspecified atrial fibrillation: Secondary | ICD-10-CM

## 2015-12-29 DIAGNOSIS — Z5181 Encounter for therapeutic drug level monitoring: Secondary | ICD-10-CM | POA: Diagnosis not present

## 2015-12-29 DIAGNOSIS — Z952 Presence of prosthetic heart valve: Secondary | ICD-10-CM

## 2015-12-29 DIAGNOSIS — Z954 Presence of other heart-valve replacement: Secondary | ICD-10-CM | POA: Diagnosis not present

## 2015-12-29 LAB — POCT INR: INR: >8

## 2015-12-31 ENCOUNTER — Ambulatory Visit (INDEPENDENT_AMBULATORY_CARE_PROVIDER_SITE_OTHER): Payer: BLUE CROSS/BLUE SHIELD | Admitting: *Deleted

## 2015-12-31 DIAGNOSIS — I4891 Unspecified atrial fibrillation: Secondary | ICD-10-CM | POA: Diagnosis not present

## 2015-12-31 DIAGNOSIS — Z5181 Encounter for therapeutic drug level monitoring: Secondary | ICD-10-CM | POA: Diagnosis not present

## 2015-12-31 DIAGNOSIS — Z954 Presence of other heart-valve replacement: Secondary | ICD-10-CM | POA: Diagnosis not present

## 2015-12-31 DIAGNOSIS — Z7901 Long term (current) use of anticoagulants: Secondary | ICD-10-CM

## 2015-12-31 DIAGNOSIS — Z952 Presence of prosthetic heart valve: Secondary | ICD-10-CM

## 2015-12-31 LAB — POCT INR: INR: 2.3

## 2016-01-14 ENCOUNTER — Ambulatory Visit (INDEPENDENT_AMBULATORY_CARE_PROVIDER_SITE_OTHER): Payer: BLUE CROSS/BLUE SHIELD | Admitting: *Deleted

## 2016-01-14 DIAGNOSIS — Z954 Presence of other heart-valve replacement: Secondary | ICD-10-CM

## 2016-01-14 DIAGNOSIS — I4891 Unspecified atrial fibrillation: Secondary | ICD-10-CM

## 2016-01-14 DIAGNOSIS — Z5181 Encounter for therapeutic drug level monitoring: Secondary | ICD-10-CM

## 2016-01-14 DIAGNOSIS — Z952 Presence of prosthetic heart valve: Secondary | ICD-10-CM

## 2016-01-14 DIAGNOSIS — Z7901 Long term (current) use of anticoagulants: Secondary | ICD-10-CM | POA: Diagnosis not present

## 2016-01-14 LAB — POCT INR: INR: 8

## 2016-01-19 ENCOUNTER — Other Ambulatory Visit (INDEPENDENT_AMBULATORY_CARE_PROVIDER_SITE_OTHER): Payer: BLUE CROSS/BLUE SHIELD

## 2016-01-19 DIAGNOSIS — M10072 Idiopathic gout, left ankle and foot: Secondary | ICD-10-CM | POA: Diagnosis not present

## 2016-01-20 ENCOUNTER — Other Ambulatory Visit: Payer: BLUE CROSS/BLUE SHIELD

## 2016-01-20 LAB — URIC ACID: Uric Acid, Serum: 9.8 mg/dL — ABNORMAL HIGH (ref 4.0–7.8)

## 2016-01-21 ENCOUNTER — Ambulatory Visit (INDEPENDENT_AMBULATORY_CARE_PROVIDER_SITE_OTHER): Payer: BLUE CROSS/BLUE SHIELD | Admitting: *Deleted

## 2016-01-21 ENCOUNTER — Telehealth: Payer: Self-pay | Admitting: Primary Care

## 2016-01-21 ENCOUNTER — Other Ambulatory Visit: Payer: Self-pay | Admitting: Family Medicine

## 2016-01-21 DIAGNOSIS — Z954 Presence of other heart-valve replacement: Secondary | ICD-10-CM

## 2016-01-21 DIAGNOSIS — I4891 Unspecified atrial fibrillation: Secondary | ICD-10-CM | POA: Diagnosis not present

## 2016-01-21 DIAGNOSIS — Z7901 Long term (current) use of anticoagulants: Secondary | ICD-10-CM

## 2016-01-21 DIAGNOSIS — Z5181 Encounter for therapeutic drug level monitoring: Secondary | ICD-10-CM

## 2016-01-21 DIAGNOSIS — M109 Gout, unspecified: Secondary | ICD-10-CM

## 2016-01-21 DIAGNOSIS — Z952 Presence of prosthetic heart valve: Secondary | ICD-10-CM

## 2016-01-21 LAB — POCT INR: INR: 2.5

## 2016-01-21 MED ORDER — VITAMIN C 500 MG PO TABS: 500.0000 mg | ORAL_TABLET | Freq: Every day | ORAL | Status: DC

## 2016-01-21 MED ORDER — PREDNISONE 10 MG PO TABS: ORAL_TABLET | ORAL | Status: DC

## 2016-01-21 NOTE — Telephone Encounter (Signed)
Please notify Mr. Ian Bowen that we will need to treat his gout with a course of steroids called Prednisone.  He should take 3 tablets by mouth daily for 3 days, then 2 tablets by mouth for 3 days, then 1 tablet by mouth for 3 days. I've sent this into his pharmacy.  He will also need preventative treatment for his gout but cannot start until after he's healed from his attack. Please schedule an office visit with either myself or Dr. Sharen Hones in 2 weeks to discuss and initiate preventative treatment.

## 2016-01-21 NOTE — Telephone Encounter (Signed)
Spoken to patient. He stated that he was advised that Prednisone will raised his INR which he is afraid to do. Patient is not having an attack at the moment. He still have a few more pills of the colchicine left. He would like to speak to [facility] or Dr Sharen Hones regarding this matter. He is concern about the prednisone and preventative treatment.

## 2016-01-21 NOTE — Telephone Encounter (Signed)
Pt left v/m requesting cb about prednisone that was sent to the pharmacy and treatment plan.

## 2016-02-04 ENCOUNTER — Ambulatory Visit (INDEPENDENT_AMBULATORY_CARE_PROVIDER_SITE_OTHER): Payer: BLUE CROSS/BLUE SHIELD | Admitting: *Deleted

## 2016-02-04 DIAGNOSIS — Z5181 Encounter for therapeutic drug level monitoring: Secondary | ICD-10-CM | POA: Diagnosis not present

## 2016-02-04 DIAGNOSIS — Z954 Presence of other heart-valve replacement: Secondary | ICD-10-CM

## 2016-02-04 DIAGNOSIS — Z952 Presence of prosthetic heart valve: Secondary | ICD-10-CM

## 2016-02-04 DIAGNOSIS — Z7901 Long term (current) use of anticoagulants: Secondary | ICD-10-CM | POA: Diagnosis not present

## 2016-02-04 DIAGNOSIS — I4891 Unspecified atrial fibrillation: Secondary | ICD-10-CM | POA: Diagnosis not present

## 2016-02-04 LAB — POCT INR: INR: 3.3

## 2016-02-09 ENCOUNTER — Telehealth: Payer: Self-pay | Admitting: Primary Care

## 2016-02-09 ENCOUNTER — Telehealth: Payer: Self-pay | Admitting: Cardiology

## 2016-02-09 DIAGNOSIS — I429 Cardiomyopathy, unspecified: Secondary | ICD-10-CM

## 2016-02-09 DIAGNOSIS — Z952 Presence of prosthetic heart valve: Secondary | ICD-10-CM

## 2016-02-09 DIAGNOSIS — E785 Hyperlipidemia, unspecified: Secondary | ICD-10-CM

## 2016-02-09 DIAGNOSIS — M109 Gout, unspecified: Secondary | ICD-10-CM

## 2016-02-09 DIAGNOSIS — I5032 Chronic diastolic (congestive) heart failure: Secondary | ICD-10-CM

## 2016-02-09 NOTE — Telephone Encounter (Signed)
Returned call to patient he stated he would like to have lab work before he sees Dr.Crenshaw 02/25/16.Stated he would like to have uric acid checked too recent gout.Lab orders mailed to patient.He will have done at [facility] at [facility] in [place].

## 2016-02-09 NOTE — Telephone Encounter (Signed)
NeW Message  Pt wanted to speak w/ Rn to see if there are any tests/labs to do before OV on 5/11. Please call back and discuss.

## 2016-02-09 NOTE — Telephone Encounter (Signed)
Route to PCP

## 2016-02-09 NOTE — Telephone Encounter (Signed)
Electronically refill request for   colchicine (COLCRYS) 0.6 MG tablet   Take 1 tablet (0.6 mg total) by mouth 2 (two) times daily.  Dispense: 14 tablet   Refills: 1     Last prescribed on 12/28/2015. Dr Gutierrez's patient. Was seen on 12/28/2015.

## 2016-02-10 NOTE — Telephone Encounter (Signed)
Pt called requesting refill on medication, he is completely out.  [facility]. COLCRYS 0.6 MG TABLET

## 2016-02-10 NOTE — Telephone Encounter (Signed)
sent in

## 2016-02-16 ENCOUNTER — Other Ambulatory Visit (INDEPENDENT_AMBULATORY_CARE_PROVIDER_SITE_OTHER): Payer: BLUE CROSS/BLUE SHIELD

## 2016-02-16 DIAGNOSIS — I5032 Chronic diastolic (congestive) heart failure: Secondary | ICD-10-CM

## 2016-02-16 DIAGNOSIS — E785 Hyperlipidemia, unspecified: Secondary | ICD-10-CM | POA: Diagnosis not present

## 2016-02-17 ENCOUNTER — Telehealth: Payer: Self-pay | Admitting: *Deleted

## 2016-02-17 LAB — BASIC METABOLIC PANEL
BUN: 9 mg/dL (ref 7–25)
CHLORIDE: 103 mmol/L (ref 98–110)
CO2: 26 mmol/L (ref 20–31)
Calcium: 9 mg/dL (ref 8.6–10.3)
Creat: 0.76 mg/dL (ref 0.70–1.25)
Glucose, Bld: 80 mg/dL (ref 65–99)
POTASSIUM: 4.5 mmol/L (ref 3.5–5.3)
SODIUM: 142 mmol/L (ref 135–146)

## 2016-02-17 LAB — HEPATIC FUNCTION PANEL
ALBUMIN: 3.9 g/dL (ref 3.6–5.1)
ALT: 65 U/L — ABNORMAL HIGH (ref 9–46)
AST: 67 U/L — ABNORMAL HIGH (ref 10–35)
Alkaline Phosphatase: 72 U/L (ref 40–115)
Bilirubin, Direct: 0.4 mg/dL — ABNORMAL HIGH (ref <=0.2)
Indirect Bilirubin: 1 mg/dL (ref 0.2–1.2)
TOTAL PROTEIN: 6.1 g/dL (ref 6.1–8.1)
Total Bilirubin: 1.4 mg/dL — ABNORMAL HIGH (ref 0.2–1.2)

## 2016-02-17 LAB — URIC ACID: URIC ACID, SERUM: 9 mg/dL — AB (ref 4.0–7.8)

## 2016-02-17 LAB — LIPID PANEL
CHOL/HDL RATIO: 2.7 ratio (ref <=5.0)
Cholesterol: 141 mg/dL (ref 125–200)
HDL: 53 mg/dL (ref >=40)
LDL CALC: 69 mg/dL (ref <130)
TRIGLYCERIDES: 95 mg/dL (ref <150)
VLDL: 19 mg/dL (ref <30)

## 2016-02-17 LAB — PSA: PSA: 1.31 ng/mL (ref <=4.00)

## 2016-02-17 NOTE — Telephone Encounter (Signed)
Spoke with pt, aware of medication changes. He has a follow up appt on 02-25-16. He also has a follow up with his pcp.

## 2016-02-17 NOTE — Progress Notes (Signed)
HPI: FU cardiomyopathy and atrial fibrilllation. Patient underwent mitral valve replacement with a CarboMedics bileaflet valve in September of 2002. Note preoperative cardiac catheterization showed normal coronary arteries and normal LV function. He apparently had subsequent repair in 2006 due to paravalvular leak. Transesophageal echocardiogram in May 2009 showed normally functioning mechanical mitral valve with no leak. LV function was normal. Patient also has a history of paroxysmal atrial fibrillation. He was seen at [facility] in September of 2012 with an episode of atrial fibrillation. Cardizem was added to his medical regimen as was Lasix and potassium. A Myoview was performed in September of 2012 and showed inferior apical infarct with minimal reversibility. Ejection fraction 18%. Echocardiogram in September of 2012 showed an ejection fraction of 20%, diffuse hypokinesis, normally functioning prosthetic mitral valve, biatrial enlargement, right ventricular enlargement with decreased function. We felt his cardiomyopathy may be tachycardia mediated vs ETOH. Holter in Oct 2012 showed his rate mildly increased and we increased his toprol. Echo repeated in July 2014 and showed EF 50-55. Prosthetic MV with mean gradient 9 mmHg, biatrial enlargement, oscillating density in LV cavity most likely related to residual MV apparatus. Since I last saw him, the patient denies any dyspnea on exertion, orthopnea, PND, pedal edema, palpitations, syncope or chest pain.   Current Outpatient Prescriptions  Medication Sig Dispense Refill  . acetaminophen (TYLENOL) 500 MG tablet Take 1,000 mg by mouth every 6 (six) hours as needed for moderate pain.    Marland Kitchen aspirin 81 MG tablet Take 81 mg by mouth at bedtime.     . colchicine (COLCRYS) 0.6 MG tablet Take 1 tablet (0.6 mg total) by mouth daily as needed (gout flare). 30 tablet 3  . furosemide (LASIX) 20 MG tablet Take 1 tablet (20 mg total) by mouth daily. 90  tablet 3  . lisinopril (PRINIVIL,ZESTRIL) 5 MG tablet Take 1 tablet (5 mg total) by mouth daily. 90 tablet 4  . metoprolol succinate (TOPROL-XL) 100 MG 24 hr tablet Take 1 tablet (100 mg total) by mouth 2 (two) times daily. Take with or immediately following a meal. 180 tablet 3  . omeprazole (PRILOSEC) 10 MG capsule Take 1 capsule (10 mg total) by mouth daily. 90 capsule 3  . tadalafil (CIALIS) 5 MG tablet Take this medication as directed by your pharmacy. No NTG within 24 hours of use. 90 tablet 1  . warfarin (COUMADIN) 5 MG tablet TAKE AS DIRECTED BY ANTICOAGULATION CLINIC 120 tablet 1   No current facility-administered medications for this visit.     Past Medical History  Diagnosis Date  . S/P MVR (mitral valve replacement)     SEPT  2002   IN   [place]  . HTN (hypertension)   . GERD (gastroesophageal reflux disease)   . ED (erectile dysfunction)   . Cardiomyopathy     per echo  05-07-2013   ef  50%  . Atrial fibrillation, permanent (HCC)   . Hyperlipidemia   . CHF (congestive heart failure) (HCC)     monitored by dr Jens Som--  euvolemic  . OSA on CPAP 2013    moderate OSA PER STUDY  09-02-2012  W/ DR CLANCE  . Prolapsed internal hemorrhoids, grade 3     Past Surgical History  Procedure Laterality Date  . Mitral valve replacement  SEPT  2002   ([facility] in [place])    CarboMedics bileaflet percardial valve  . Cardioversion  2006  . Right knee surgery    . Amputation  finger / thumb  1969    left ring finger  . Retinal surx      OD  . Colonoscopy  2011    per pt report normal  . Mitral valve repair  2006   ( in [place])    secondary to paravalvular leak  . Knee arthroscopy Left   . Cardiovascular stress test  07-15-2011   DR CRENSHAW    SEVERE LV SYSTOLIC DYSFUNCTION WITH INFEROAPICAL SCAR WITH MINIMAL REVERSIBILITY/   EF 18%  . Transthoracic echocardiogram  05-07-2013    DR CRENSHAW    MILD LVH/  EF  50%/  OSCILLATING DENSITY IN LV CAVITY MOST LIKELY  RESIDUAL MV APPARATUS FROM PREVIOUS MVR,  MILDLY ELEVATED MEAN GRADIENT OF 44mmHg/  SEVERE LAD/  MODERATE  RAD/  RV  MILD DILATED/   MILD   TR/  TRIVIAL  PR  . Cardiac catheterization  05-03-2001      NORMAL CORONARIES/  NORMAL LVF WITH LV MILDY DILATED/  SEVERE MITRAL INSUFFICIENCY    Social History   Social History  . Marital Status: Single    Spouse Name: N/A  . Number of Children: 2  . Years of Education: N/A   Occupational History  .  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 Smoking status: Never Smoker   . Smokeless tobacco: Never Used  . Alcohol Use: 6.0 oz/week    4 Glasses of wine, 6 Cans of beer per week     Comment: previously wine/beer (bottle wine nightly).  stopped with results of echo- restarted.  . Drug Use: No  . Sexual Activity: Not on file   Other Topics Concern  . Not on file   Social History Narrative   Caffeine: 2 cups soda/day   Lives alone, no pets, s/p 2 divorces   Occupation: Regulatory affairs officer for [employer]   Activity: stays active at work, no regular exercise   Diet: some salads, trying to eat more healthy    Family History  Problem Relation Age of Onset  . Heart attack Father     died of MI at age 60  . Coronary artery disease Father   . Cancer Mother 12    spleen cancer  . Coronary artery disease Paternal Uncle   . Coronary artery disease Paternal Grandfather   . Stroke Neg Hx   . Diabetes Neg Hx     ROS: no fevers or chills, productive cough, hemoptysis, dysphasia, odynophagia, melena, hematochezia, dysuria, hematuria, rash, seizure activity, orthopnea, PND, pedal edema, claudication. Remaining systems are negative.  Physical Exam: Well-developed well-nourished in no acute distress.  Skin is warm and dry.  HEENT is normal.  Neck is supple.  Chest is clear to auscultation with normal expansion.  Cardiovascular exam is irregular, crisp mechanical valve Abdominal exam nontender or distended. No masses palpated. Extremities show no  edema. neuro grossly intact  ECG Atrial fibrillation at a rate of 97. Occasional PVC or aberrantly conducted beat.

## 2016-02-17 NOTE — Telephone Encounter (Signed)
-----   Message from Lewayne Bunting, MD sent at 02/17/2016  6:07 AM EDT ----- Dc mevacor, lfts 4 weeks, fu primary care for elevated uric acid Olga Millers

## 2016-02-25 ENCOUNTER — Ambulatory Visit (INDEPENDENT_AMBULATORY_CARE_PROVIDER_SITE_OTHER): Payer: BLUE CROSS/BLUE SHIELD | Admitting: Pharmacist Clinician (PhC)/ Clinical Pharmacy Specialist

## 2016-02-25 ENCOUNTER — Encounter: Payer: Self-pay | Admitting: Cardiology

## 2016-02-25 ENCOUNTER — Ambulatory Visit (INDEPENDENT_AMBULATORY_CARE_PROVIDER_SITE_OTHER): Payer: BLUE CROSS/BLUE SHIELD | Admitting: Cardiology

## 2016-02-25 VITALS — BP 126/80 | HR 97 | Ht 70.0 in | Wt 187.6 lb

## 2016-02-25 DIAGNOSIS — I4891 Unspecified atrial fibrillation: Secondary | ICD-10-CM

## 2016-02-25 DIAGNOSIS — R945 Abnormal results of liver function studies: Secondary | ICD-10-CM

## 2016-02-25 DIAGNOSIS — Z954 Presence of other heart-valve replacement: Secondary | ICD-10-CM

## 2016-02-25 DIAGNOSIS — R7989 Other specified abnormal findings of blood chemistry: Secondary | ICD-10-CM

## 2016-02-25 DIAGNOSIS — Z7901 Long term (current) use of anticoagulants: Secondary | ICD-10-CM | POA: Diagnosis not present

## 2016-02-25 DIAGNOSIS — Z5181 Encounter for therapeutic drug level monitoring: Secondary | ICD-10-CM

## 2016-02-25 DIAGNOSIS — M2550 Pain in unspecified joint: Secondary | ICD-10-CM | POA: Diagnosis not present

## 2016-02-25 DIAGNOSIS — Z952 Presence of prosthetic heart valve: Secondary | ICD-10-CM

## 2016-02-25 DIAGNOSIS — I5032 Chronic diastolic (congestive) heart failure: Secondary | ICD-10-CM

## 2016-02-25 DIAGNOSIS — I429 Cardiomyopathy, unspecified: Secondary | ICD-10-CM

## 2016-02-25 DIAGNOSIS — I480 Paroxysmal atrial fibrillation: Secondary | ICD-10-CM

## 2016-02-25 DIAGNOSIS — I10 Essential (primary) hypertension: Secondary | ICD-10-CM | POA: Diagnosis not present

## 2016-02-25 LAB — POCT INR: INR: 6.1

## 2016-02-25 MED ORDER — TADALAFIL 5 MG PO TABS: ORAL_TABLET | ORAL | Status: DC

## 2016-02-25 NOTE — Assessment & Plan Note (Addendum)
Continue SBE prophylaxis. Continue Coumadin and aspirin. 

## 2016-02-25 NOTE — Assessment & Plan Note (Signed)
Patient's liver functions recently elevated.Mevacor discontinued. Recheck liver functions in 4 weeks.

## 2016-02-25 NOTE — Patient Instructions (Signed)
Medication Instructions:   NO CHANGE  Labwork:  Your physician recommends that you return for lab work in: 4 WEEKS  Follow-Up:  Your physician wants you to follow-up in: ONE YEAR WITH DR Shelda Pal will receive a reminder letter in the mail two months in advance. If you don't receive a letter, please call our office to schedule the follow-up appointment.   If you need a refill on your cardiac medications before your next appointment, please call your pharmacy.

## 2016-02-25 NOTE — Assessment & Plan Note (Signed)
Euvolemic onExam. Continue present dose of Lasix.

## 2016-02-25 NOTE — Addendum Note (Signed)
Addended by: Freddi Starr on: 02/25/2016 08:02 AM   Modules accepted: Orders

## 2016-02-25 NOTE — Assessment & Plan Note (Signed)
Blood pressure controlled. Continue present medications. 

## 2016-02-25 NOTE — Assessment & Plan Note (Signed)
LV function improved on most recent echo. Continue ACE inhibitor and beta blocker. 

## 2016-02-25 NOTE — Assessment & Plan Note (Signed)
Patient remains in permanent atrial fibrillation. Continue beta blocker and Coumadin. 

## 2016-03-01 ENCOUNTER — Other Ambulatory Visit: Payer: Self-pay | Admitting: *Deleted

## 2016-03-01 MED ORDER — COLCHICINE 0.6 MG PO CAPS: 1.0000 | ORAL_CAPSULE | Freq: Every day | ORAL | Status: DC | PRN

## 2016-03-01 NOTE — Addendum Note (Signed)
Addended by: Eustaquio Boyden on: 03/01/2016 05:25 PM   Modules accepted: Orders, Medications

## 2016-03-01 NOTE — Telephone Encounter (Signed)
Patient advised.

## 2016-03-01 NOTE — Telephone Encounter (Signed)
Sent in mitigare. plz notify new medicine should be at pharmacy. I'm sorry I was never notified insurance denied colchicine sent in 3 wks ago.

## 2016-03-01 NOTE — Telephone Encounter (Signed)
Pt request cb with status of gout med being sent to [facility]; Anna at [employer] said ins requiring new rx for Mitigare 0.6 mg capsule.Please advise.

## 2016-03-02 ENCOUNTER — Telehealth: Payer: Self-pay | Admitting: Cardiology

## 2016-03-02 ENCOUNTER — Other Ambulatory Visit: Payer: Self-pay | Admitting: *Deleted

## 2016-03-02 MED ORDER — MITIGARE 0.6 MG PO CAPS: 1.0000 | ORAL_CAPSULE | Freq: Every day | ORAL | Status: DC | PRN

## 2016-03-02 NOTE — Telephone Encounter (Signed)
Spoke with pharmacy and clarified Cialis directions as one daily as needed per patient

## 2016-03-02 NOTE — Telephone Encounter (Signed)
New Message   [facility] called to discuss directions for med Cialis. Please call back and discuss.

## 2016-03-02 NOTE — Telephone Encounter (Signed)
Mitigare needed to DAW for insurance coverage. Re-sent to pharmacy.

## 2016-03-09 ENCOUNTER — Ambulatory Visit (INDEPENDENT_AMBULATORY_CARE_PROVIDER_SITE_OTHER): Payer: BLUE CROSS/BLUE SHIELD | Admitting: *Deleted

## 2016-03-09 ENCOUNTER — Other Ambulatory Visit: Payer: BLUE CROSS/BLUE SHIELD

## 2016-03-09 DIAGNOSIS — Z954 Presence of other heart-valve replacement: Secondary | ICD-10-CM | POA: Diagnosis not present

## 2016-03-09 DIAGNOSIS — Z5181 Encounter for therapeutic drug level monitoring: Secondary | ICD-10-CM | POA: Diagnosis not present

## 2016-03-09 DIAGNOSIS — I4891 Unspecified atrial fibrillation: Secondary | ICD-10-CM | POA: Diagnosis not present

## 2016-03-09 DIAGNOSIS — Z952 Presence of prosthetic heart valve: Secondary | ICD-10-CM

## 2016-03-09 DIAGNOSIS — Z7901 Long term (current) use of anticoagulants: Secondary | ICD-10-CM | POA: Diagnosis not present

## 2016-03-09 LAB — POCT INR: INR: 3.7

## 2016-03-15 ENCOUNTER — Encounter: Payer: BLUE CROSS/BLUE SHIELD | Admitting: Family Medicine

## 2016-03-16 ENCOUNTER — Ambulatory Visit (INDEPENDENT_AMBULATORY_CARE_PROVIDER_SITE_OTHER): Payer: BLUE CROSS/BLUE SHIELD | Admitting: Family Medicine

## 2016-03-16 ENCOUNTER — Encounter: Payer: Self-pay | Admitting: Family Medicine

## 2016-03-16 VITALS — BP 116/76 | HR 68 | Temp 98.1°F | Ht 69.0 in | Wt 182.0 lb

## 2016-03-16 DIAGNOSIS — Z0000 Encounter for general adult medical examination without abnormal findings: Secondary | ICD-10-CM

## 2016-03-16 DIAGNOSIS — Z7289 Other problems related to lifestyle: Secondary | ICD-10-CM

## 2016-03-16 DIAGNOSIS — E785 Hyperlipidemia, unspecified: Secondary | ICD-10-CM

## 2016-03-16 DIAGNOSIS — G4733 Obstructive sleep apnea (adult) (pediatric): Secondary | ICD-10-CM

## 2016-03-16 DIAGNOSIS — I4891 Unspecified atrial fibrillation: Secondary | ICD-10-CM

## 2016-03-16 DIAGNOSIS — M1A0790 Idiopathic chronic gout, unspecified ankle and foot, without tophus (tophi): Secondary | ICD-10-CM

## 2016-03-16 DIAGNOSIS — M109 Gout, unspecified: Secondary | ICD-10-CM | POA: Insufficient documentation

## 2016-03-16 DIAGNOSIS — I5032 Chronic diastolic (congestive) heart failure: Secondary | ICD-10-CM | POA: Diagnosis not present

## 2016-03-16 DIAGNOSIS — I10 Essential (primary) hypertension: Secondary | ICD-10-CM | POA: Diagnosis not present

## 2016-03-16 DIAGNOSIS — Z789 Other specified health status: Secondary | ICD-10-CM

## 2016-03-16 DIAGNOSIS — K219 Gastro-esophageal reflux disease without esophagitis: Secondary | ICD-10-CM

## 2016-03-16 MED ORDER — COLCHICINE 0.6 MG PO TABS: 0.6000 mg | ORAL_TABLET | Freq: Every day | ORAL | Status: DC

## 2016-03-16 MED ORDER — ALLOPURINOL 100 MG PO TABS: 100.0000 mg | ORAL_TABLET | Freq: Every day | ORAL | Status: DC

## 2016-03-16 MED ORDER — VITAMIN C 500 MG PO TABS: 500.0000 mg | ORAL_TABLET | Freq: Every day | ORAL | Status: AC

## 2016-03-16 NOTE — Assessment & Plan Note (Signed)
Chronic, stable. Doing well on lovastatin - has actually been off med for last few weeks due to transaminitis.

## 2016-03-16 NOTE — Patient Instructions (Addendum)
Colcrys sent to pharmacy Start allopurinol 100mg  once daily - notify coumadin clinic we're starting this medicine as it can affect coumadin levels. Return in 2-3 weeks for labs to recheck uric acid levels. Watch for gout flare with starting allopurinol and start colcrys if needed. You are doing well today.  Return as needed or in 6 months for follow up visit.   Health Maintenance, Male A healthy lifestyle and preventative care can promote health and wellness.  Maintain regular health, dental, and eye exams.  Eat a healthy diet. Foods like vegetables, fruits, whole grains, low-fat dairy products, and lean protein foods contain the nutrients you need and are low in calories. Decrease your intake of foods high in solid fats, added sugars, and salt. Get information about a proper diet from your health care provider, if necessary.  Regular physical exercise is one of the most important things you can do for your health. Most adults should get at least 150 minutes of moderate-intensity exercise (any activity that increases your heart rate and causes you to sweat) each week. In addition, most adults need muscle-strengthening exercises on 2 or more days a week.   Maintain a healthy weight. The body mass index (BMI) is a screening tool to identify possible weight problems. It provides an estimate of body fat based on height and weight. Your health care provider can find your BMI and can help you achieve or maintain a healthy weight. For males 20 years and older:  A BMI below 18.5 is considered underweight.  A BMI of 18.5 to 24.9 is normal.  A BMI of 25 to 29.9 is considered overweight.  A BMI of 30 and above is considered obese.  Maintain normal blood lipids and cholesterol by exercising and minimizing your intake of saturated fat. Eat a balanced diet with plenty of fruits and vegetables. Blood tests for lipids and cholesterol should begin at age 22 and be repeated every 5 years. If your lipid or  cholesterol levels are high, you are over age 74, or you are at high risk for heart disease, you may need your cholesterol levels checked more frequently.Ongoing high lipid and cholesterol levels should be treated with medicines if diet and exercise are not working.  If you smoke, find out from your health care provider how to quit. If you do not use tobacco, do not start.  Lung cancer screening is recommended for adults aged 55-80 years who are at high risk for developing lung cancer because of a history of smoking. A yearly low-dose CT scan of the lungs is recommended for people who have at least a 30-pack-year history of smoking and are current smokers or have quit within the past 15 years. A pack year of smoking is smoking an average of 1 pack of cigarettes a day for 1 year (for example, a 30-pack-year history of smoking could mean smoking 1 pack a day for 30 years or 2 packs a day for 15 years). Yearly screening should continue until the smoker has stopped smoking for at least 15 years. Yearly screening should be stopped for people who develop a health problem that would prevent them from having lung cancer treatment.  If you choose to drink alcohol, do not have more than 2 drinks per day. One drink is considered to be 12 oz (360 mL) of beer, 5 oz (150 mL) of wine, or 1.5 oz (45 mL) of liquor.  Avoid the use of street drugs. Do not share needles with anyone. Ask for  help if you need support or instructions about stopping the use of drugs.  High blood pressure causes heart disease and increases the risk of stroke. High blood pressure is more likely to develop in:  People who have blood pressure in the end of the normal range (100-139/85-89 mm Hg).  People who are overweight or obese.  People who are African American.  If you are 32-30 years of age, have your blood pressure checked every 3-5 years. If you are 70 years of age or older, have your blood pressure checked every year. You should have  your blood pressure measured twice--once when you are at a hospital or clinic, and once when you are not at a hospital or clinic. Record the average of the two measurements. To check your blood pressure when you are not at a hospital or clinic, you can use:  An automated blood pressure machine at a pharmacy.  A home blood pressure monitor.  If you are 79-41 years old, ask your health care provider if you should take aspirin to prevent heart disease.  Diabetes screening involves taking a blood sample to check your fasting blood sugar level. This should be done once every 3 years after age 43 if you are at a normal weight and without risk factors for diabetes. Testing should be considered at a younger age or be carried out more frequently if you are overweight and have at least 1 risk factor for diabetes.  Colorectal cancer can be detected and often prevented. Most routine colorectal cancer screening begins at the age of 26 and continues through age 69. However, your health care provider may recommend screening at an earlier age if you have risk factors for colon cancer. On a yearly basis, your health care provider may provide home test kits to check for hidden blood in the stool. A small camera at the end of a tube may be used to directly examine the colon (sigmoidoscopy or colonoscopy) to detect the earliest forms of colorectal cancer. Talk to your health care provider about this at age 72 when routine screening begins. A direct exam of the colon should be repeated every 5-10 years through age 73, unless early forms of precancerous polyps or small growths are found.  People who are at an increased risk for hepatitis B should be screened for this virus. You are considered at high risk for hepatitis B if:  You were born in a country where hepatitis B occurs often. Talk with your health care provider about which countries are considered high risk.  Your parents were born in a high-risk country and you  have not received a shot to protect against hepatitis B (hepatitis B vaccine).  You have HIV or AIDS.  You use needles to inject street drugs.  You live with, or have sex with, someone who has hepatitis B.  You are a man who has sex with other men (MSM).  You get hemodialysis treatment.  You take certain medicines for conditions like cancer, organ transplantation, and autoimmune conditions.  Hepatitis C blood testing is recommended for all people born from [birth year] through 1964 and any individual with known risk factors for hepatitis C.  Healthy men should no longer receive prostate-specific antigen (PSA) blood tests as part of routine cancer screening. Talk to your health care provider about prostate cancer screening.  Testicular cancer screening is not recommended for adolescents or adult males who have no symptoms. Screening includes self-exam, a health care provider exam, and other screening  tests. Consult with your health care provider about any symptoms you have or any concerns you have about testicular cancer.  Practice safe sex. Use condoms and avoid high-risk sexual practices to reduce the spread of sexually transmitted infections (STIs).  You should be screened for STIs, including gonorrhea and chlamydia if:  You are sexually active and are younger than 24 years.  You are older than 24 years, and your health care provider tells you that you are at risk for this type of infection.  Your sexual activity has changed since you were last screened, and you are at an increased risk for chlamydia or gonorrhea. Ask your health care provider if you are at risk.  If you are at risk of being infected with HIV, it is recommended that you take a prescription medicine daily to prevent HIV infection. This is called pre-exposure prophylaxis (PrEP). You are considered at risk if:  You are a man who has sex with other men (MSM).  You are a heterosexual man who is sexually active with multiple  partners.  You take drugs by injection.  You are sexually active with a partner who has HIV.  Talk with your health care provider about whether you are at high risk of being infected with HIV. If you choose to begin PrEP, you should first be tested for HIV. You should then be tested every 3 months for as long as you are taking PrEP.  Use sunscreen. Apply sunscreen liberally and repeatedly throughout the day. You should seek shade when your shadow is shorter than you. Protect yourself by wearing long sleeves, pants, a wide-brimmed hat, and sunglasses year round whenever you are outdoors.  Tell your health care provider of new moles or changes in moles, especially if there is a change in shape or color. Also, tell your health care provider if a mole is larger than the size of a pencil eraser.  A one-time screening for abdominal aortic aneurysm (AAA) and surgical repair of large AAAs by ultrasound is recommended for men aged 34-75 years who are current or former smokers.  Stay current with your vaccines (immunizations).   This information is not intended to replace advice given to you by your health care provider. Make sure you discuss any questions you have with your health care provider.   Document Released: 03/31/2008 Document Revised: 10/24/2014 Document Reviewed: 02/28/2011 Elsevier Interactive Patient Education [employer].

## 2016-03-16 NOTE — Assessment & Plan Note (Signed)
On coumadin. [facility].

## 2016-03-16 NOTE — Progress Notes (Addendum)
BP 116/76 mmHg  Pulse 68  Temp(Src) 98.1 F (36.7 C) (Oral)  Ht 5\' 9"  (1.753 m)  Wt 182 lb (82.555 kg)  BMI 26.86 kg/m2   CC: CPE  Subjective:    Patient ID: Ian Bowen, male    DOB: 10-Aug-1951, 65 y.o.   MRN: [id number]  HPI: Ian Bowen is a 65 y.o. male presenting on 03/16/2016 for Annual Exam   Recent recurrent gout flares (04/2015 again 12/2015) - insurance did not cover colchicine so mitigare was started - takes 0.6mg  daily prn. mitigare not helping at all (colcrys worked better). Ongoing gout flares coming on every other week (more noticeable on trips to [place] - increased beer on these trips).   Recent transaminitis - lovastatin currently on hold per cardiology. To have rpt LFTs in 2 wks by cards. Denies abd pain, nausea.   Preventative: COLONOSCOPY Date: 2011 per pt report normal. Done in [facility]. states rec rpt 10 yrs. 2nd normal per patient. Prostate - checked yearly, always normal. Flu shot - doesn't take. Got sick last time he got flu shot  Tdap 2011  Zostavax - discussed - declines for now  Seat belt use discussed.  Sunscreen use discussed. No changing spots on skin.  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 Interim medical history since our last visit reviewed. Allergies and medications reviewed and updated. Current Outpatient Prescriptions on File Prior to Visit  Medication Sig  . acetaminophen (TYLENOL) 500 MG tablet Take 1,000 mg by mouth every 6 (six) hours as needed for moderate pain.  Marland Kitchen aspirin 81 MG tablet Take 81 mg by mouth at bedtime.   . furosemide (LASIX) 20 MG tablet Take 1 tablet (20 mg total) by mouth daily.  Marland Kitchen lisinopril  (PRINIVIL,ZESTRIL) 5 MG tablet Take 1 tablet (5 mg total) by mouth daily.  . metoprolol succinate (TOPROL-XL) 100 MG 24 hr tablet Take 1 tablet (100 mg total) by mouth 2 (two) times daily. Take with or immediately following a meal.  . omeprazole (PRILOSEC) 10 MG capsule Take 1 capsule (10 mg total) by mouth daily.  . tadalafil (CIALIS) 5 MG tablet Take 5 mg by mouth daily as needed.  . warfarin (COUMADIN) 5 MG tablet TAKE AS DIRECTED BY ANTICOAGULATION CLINIC   No current facility-administered medications on file prior to visit.    Review of Systems  Constitutional: Negative for fever, chills, activity change, appetite change, fatigue and unexpected weight change.  HENT: Negative for hearing loss.   Eyes: Negative for visual disturbance.  Respiratory: Negative for cough, chest tightness, shortness of breath and wheezing.   Cardiovascular: Negative for chest pain, palpitations and leg swelling.  Gastrointestinal: Negative for nausea, vomiting, abdominal pain, diarrhea, constipation, blood in stool and abdominal distention.  Genitourinary: Negative for hematuria and difficulty urinating.  Musculoskeletal: Negative for myalgias, arthralgias and neck pain.  Skin: Negative for rash.  Neurological: Negative for dizziness, seizures, syncope and headaches.  Hematological: Negative for adenopathy. Does not bruise/bleed easily.  Psychiatric/Behavioral: Negative for dysphoric mood. The patient is not nervous/anxious.    Per HPI unless specifically indicated in ROS section     Objective:    BP 116/76 mmHg  Pulse 68  Temp(Src) 98.1  F (36.7 C) (Oral)  Ht 5\' 9"  (1.753 m)  Wt 182 lb (82.555 kg)  BMI 26.86 kg/m2  Wt Readings from Last 3 Encounters:  03/16/16 182 lb (82.555 kg)  02/25/16 187 lb 9.6 oz (85.095 kg)  12/28/15 197 lb 6.4 oz (89.54 kg)    Physical Exam  Constitutional: He is oriented to person, place, and time. He appears well-developed and well-nourished. No distress.  HENT:    Head: Normocephalic and atraumatic.  Right Ear: Hearing, tympanic membrane, external ear and ear canal normal.  Left Ear: Hearing, tympanic membrane, external ear and ear canal normal.  Nose: Nose normal.  Mouth/Throat: Uvula is midline, oropharynx is clear and moist and mucous membranes are normal. No oropharyngeal exudate, posterior oropharyngeal edema or posterior oropharyngeal erythema.  Eyes: Conjunctivae and EOM are normal. Pupils are equal, round, and reactive to light. No scleral icterus.  Neck: Normal range of motion. Neck supple. Carotid bruit is not present. No thyromegaly present.  Cardiovascular: Normal rate, regular rhythm, normal heart sounds and intact distal pulses.   No murmur heard. Pulses:      Radial pulses are 2+ on the right side, and 2+ on the left side.  Pulmonary/Chest: Effort normal and breath sounds normal. No respiratory distress. He has no wheezes. He has no rales.  Abdominal: Soft. Bowel sounds are normal. He exhibits no distension and no mass. There is no tenderness. There is no rebound and no guarding.  Genitourinary:  DRE - forgot, check next visit  Musculoskeletal: Normal range of motion. He exhibits no edema.  No pain at feet today 2+ DP bilaterally  Lymphadenopathy:    He has no cervical adenopathy.  Neurological: He is alert and oriented to person, place, and time.  CN grossly intact, station and gait intact  Skin: Skin is warm and dry. No rash noted.  Psychiatric: He has a normal mood and affect. His behavior is normal. Judgment and thought content normal.  Nursing note and vitals reviewed.  Results for orders placed or performed in visit on 03/09/16  POCT INR  Result Value Ref Range   INR 3.7       Assessment & Plan:  Forgot DRE - check next visit. Problem List Items Addressed This Visit    CHF (congestive heart failure) (HCC)    Appreciate cards care of patient. euvolemic      Atrial fibrillation (HCC)    On coumadin. Followed by  cards coumadin clinic.      Hypertension    Chronic, stable. Continue current regimen.      Hyperlipidemia    Chronic, stable. Doing well on lovastatin - has actually been off med for last few weeks due to transaminitis.       GERD (gastroesophageal reflux disease)    Controlled on OTC prilosec 10mg  daily.      OSA (obstructive sleep apnea)    Controlled with mouthpiece/CPAP. F/u pending with pulm.      Healthcare maintenance - Primary    Preventative protocols reviewed and updated unless pt declined. Discussed healthy diet and lifestyle.       Alcohol use (HCC)    Chronic. Will need to monitor LFTs. Currently off lovastatin.      Gout    Reviewed gout diagnosis - ongoing trouble since 10/2013.  Discussed dietary choices to control uric acid levels. rec start vit C 500mg  daily. Pt also treating with apple cider vinegar and tart cherry juice. Given recent recurrent flares, start allopurinol. Will notify coumadin  clinic we're starting allopurinol  daily. Pt will return in 2 wks to recheck urate levels and titrate allopurinol accordingly.       Relevant Orders   Uric acid       Follow up plan: Return in about 6 months (around 09/15/2016), or as needed, for follow up visit.  Eustaquio Boyden, MD

## 2016-03-16 NOTE — Assessment & Plan Note (Signed)
Chronic, stable. Continue current regimen. 

## 2016-03-16 NOTE — Assessment & Plan Note (Signed)
Preventative protocols reviewed and updated unless pt declined. Discussed healthy diet and lifestyle.  

## 2016-03-16 NOTE — Assessment & Plan Note (Addendum)
Chronic. Will need to monitor LFTs. Currently off lovastatin.

## 2016-03-16 NOTE — Progress Notes (Signed)
Pre visit review using our clinic review tool, if applicable. No additional management support is needed unless otherwise documented below in the visit note. 

## 2016-03-16 NOTE — Assessment & Plan Note (Signed)
Reviewed gout diagnosis - ongoing trouble since 10/2013.  Discussed dietary choices to control uric acid levels. rec start vit C 500mg  daily. Pt also treating with apple cider vinegar and tart cherry juice. Given recent recurrent flares, start allopurinol. Will notify coumadin clinic we're starting allopurinol 100mg  daily. Pt will return in 2 wks to recheck urate levels and titrate allopurinol accordingly.

## 2016-03-16 NOTE — Addendum Note (Signed)
Addended by: Eustaquio Boyden on: 03/16/2016 11:18 AM   Modules accepted: Kipp Brood

## 2016-03-16 NOTE — Assessment & Plan Note (Addendum)
Appreciate cards care of patient. euvolemic

## 2016-03-16 NOTE — Assessment & Plan Note (Signed)
Controlled on OTC prilosec 10mg  daily.

## 2016-03-16 NOTE — Assessment & Plan Note (Signed)
Controlled with mouthpiece/CPAP. F/u pending with pulm.

## 2016-03-30 ENCOUNTER — Other Ambulatory Visit (INDEPENDENT_AMBULATORY_CARE_PROVIDER_SITE_OTHER): Payer: BLUE CROSS/BLUE SHIELD

## 2016-03-30 ENCOUNTER — Other Ambulatory Visit: Payer: Self-pay | Admitting: Family Medicine

## 2016-03-30 ENCOUNTER — Ambulatory Visit (INDEPENDENT_AMBULATORY_CARE_PROVIDER_SITE_OTHER): Payer: BLUE CROSS/BLUE SHIELD | Admitting: Pharmacist

## 2016-03-30 DIAGNOSIS — Z7901 Long term (current) use of anticoagulants: Secondary | ICD-10-CM | POA: Diagnosis not present

## 2016-03-30 DIAGNOSIS — Z952 Presence of prosthetic heart valve: Secondary | ICD-10-CM

## 2016-03-30 DIAGNOSIS — R740 Nonspecific elevation of levels of transaminase and lactic acid dehydrogenase [LDH]: Secondary | ICD-10-CM

## 2016-03-30 DIAGNOSIS — M1A0790 Idiopathic chronic gout, unspecified ankle and foot, without tophus (tophi): Secondary | ICD-10-CM | POA: Diagnosis not present

## 2016-03-30 DIAGNOSIS — I4891 Unspecified atrial fibrillation: Secondary | ICD-10-CM

## 2016-03-30 DIAGNOSIS — Z954 Presence of other heart-valve replacement: Secondary | ICD-10-CM

## 2016-03-30 DIAGNOSIS — R7401 Elevation of levels of liver transaminase levels: Secondary | ICD-10-CM

## 2016-03-30 DIAGNOSIS — Z5181 Encounter for therapeutic drug level monitoring: Secondary | ICD-10-CM

## 2016-03-30 LAB — IBC PANEL
Iron: 163 ug/dL (ref 42–165)
Saturation Ratios: 45.5 % (ref 20.0–50.0)
TRANSFERRIN: 256 mg/dL (ref 212.0–360.0)

## 2016-03-30 LAB — CBC WITH DIFFERENTIAL/PLATELET
BASOS ABS: 0.1 10*3/uL (ref 0.0–0.1)
Basophils Relative: 1.3 % (ref 0.0–3.0)
EOS PCT: 6.6 % — AB (ref 0.0–5.0)
Eosinophils Absolute: 0.3 10*3/uL (ref 0.0–0.7)
HCT: 44 % (ref 39.0–52.0)
HEMOGLOBIN: 14.6 g/dL (ref 13.0–17.0)
LYMPHS ABS: 1.1 10*3/uL (ref 0.7–4.0)
Lymphocytes Relative: 25.7 % (ref 12.0–46.0)
MCHC: 33.1 g/dL (ref 30.0–36.0)
MCV: 98.6 fl (ref 78.0–100.0)
MONO ABS: 0.5 10*3/uL (ref 0.1–1.0)
MONOS PCT: 12 % (ref 3.0–12.0)
Neutro Abs: 2.2 10*3/uL (ref 1.4–7.7)
Neutrophils Relative %: 54.4 % (ref 43.0–77.0)
Platelets: 239 10*3/uL (ref 150.0–400.0)
RBC: 4.47 Mil/uL (ref 4.22–5.81)
RDW: 13.7 % (ref 11.5–15.5)
WBC: 4.1 10*3/uL (ref 4.0–10.5)

## 2016-03-30 LAB — TSH: TSH: 1.99 u[IU]/mL (ref 0.35–4.50)

## 2016-03-30 LAB — HEPATIC FUNCTION PANEL
ALK PHOS: 41 U/L (ref 39–117)
ALT: 14 U/L (ref 0–53)
AST: 21 U/L (ref 0–37)
Albumin: 4.1 g/dL (ref 3.5–5.2)
BILIRUBIN DIRECT: 0.3 mg/dL (ref 0.0–0.3)
BILIRUBIN TOTAL: 1.4 mg/dL — AB (ref 0.2–1.2)
Total Protein: 6.6 g/dL (ref 6.0–8.3)

## 2016-03-30 LAB — POCT INR: INR: 1.9

## 2016-03-30 LAB — URIC ACID: URIC ACID, SERUM: 6.5 mg/dL (ref 4.0–7.8)

## 2016-03-31 LAB — HEPATITIS PANEL, ACUTE
HCV Ab: NEGATIVE
HEP A IGM: NONREACTIVE
HEP B S AG: NEGATIVE
Hep B C IgM: NONREACTIVE

## 2016-04-02 ENCOUNTER — Other Ambulatory Visit: Payer: Self-pay | Admitting: Family Medicine

## 2016-04-02 DIAGNOSIS — R7401 Elevation of levels of liver transaminase levels: Secondary | ICD-10-CM

## 2016-04-02 DIAGNOSIS — R7402 Elevation of levels of lactic acid dehydrogenase (LDH): Secondary | ICD-10-CM | POA: Insufficient documentation

## 2016-04-02 DIAGNOSIS — R740 Nonspecific elevation of levels of transaminase and lactic acid dehydrogenase [LDH]: Secondary | ICD-10-CM

## 2016-04-02 DIAGNOSIS — E785 Hyperlipidemia, unspecified: Secondary | ICD-10-CM

## 2016-04-02 MED ORDER — LOVASTATIN 10 MG PO TABS: 10.0000 mg | ORAL_TABLET | Freq: Every day | ORAL | Status: DC

## 2016-04-13 ENCOUNTER — Ambulatory Visit (INDEPENDENT_AMBULATORY_CARE_PROVIDER_SITE_OTHER): Payer: BLUE CROSS/BLUE SHIELD | Admitting: *Deleted

## 2016-04-13 DIAGNOSIS — Z7901 Long term (current) use of anticoagulants: Secondary | ICD-10-CM

## 2016-04-13 DIAGNOSIS — Z5181 Encounter for therapeutic drug level monitoring: Secondary | ICD-10-CM | POA: Diagnosis not present

## 2016-04-13 DIAGNOSIS — Z954 Presence of other heart-valve replacement: Secondary | ICD-10-CM | POA: Diagnosis not present

## 2016-04-13 DIAGNOSIS — I4891 Unspecified atrial fibrillation: Secondary | ICD-10-CM | POA: Diagnosis not present

## 2016-04-13 DIAGNOSIS — Z952 Presence of prosthetic heart valve: Secondary | ICD-10-CM

## 2016-04-13 LAB — POCT INR: INR: 2.2

## 2016-05-04 ENCOUNTER — Ambulatory Visit (INDEPENDENT_AMBULATORY_CARE_PROVIDER_SITE_OTHER): Payer: BLUE CROSS/BLUE SHIELD | Admitting: *Deleted

## 2016-05-04 DIAGNOSIS — Z5181 Encounter for therapeutic drug level monitoring: Secondary | ICD-10-CM

## 2016-05-04 DIAGNOSIS — Z952 Presence of prosthetic heart valve: Secondary | ICD-10-CM

## 2016-05-04 DIAGNOSIS — Z954 Presence of other heart-valve replacement: Secondary | ICD-10-CM

## 2016-05-04 DIAGNOSIS — I4891 Unspecified atrial fibrillation: Secondary | ICD-10-CM | POA: Diagnosis not present

## 2016-05-04 LAB — POCT INR: INR: 3.7

## 2016-05-09 ENCOUNTER — Ambulatory Visit (INDEPENDENT_AMBULATORY_CARE_PROVIDER_SITE_OTHER): Payer: BLUE CROSS/BLUE SHIELD | Admitting: Pulmonary Disease

## 2016-05-09 ENCOUNTER — Encounter: Payer: Self-pay | Admitting: Pulmonary Disease

## 2016-05-09 DIAGNOSIS — G4733 Obstructive sleep apnea (adult) (pediatric): Secondary | ICD-10-CM

## 2016-05-09 NOTE — Addendum Note (Signed)
Addended by: York Ram on: 05/09/2016 05:18 PM   Modules accepted: Orders

## 2016-05-09 NOTE — Assessment & Plan Note (Signed)
Change to auto CPAP 10-15 cm Let us know if this pressure causes any problems  He will continue to use oral appliance on weekends when he is traveling  Weight loss encouraged, compliance with goal of at least 4-6 hrs every night is the expectation. Advised against medications with sedative side effects Cautioned against driving when sleepy - understanding that sleepiness will vary on a day to day basis

## 2016-05-09 NOTE — Patient Instructions (Signed)
Change to auto CPAP 10-15 cm Let us know if this pressure causes any problems Good luck !

## 2016-05-09 NOTE — Progress Notes (Signed)
   Subjective:    Patient ID: Ian Bowen, male    DOB: Jun 19, 1951, 65 y.o.   MRN: [id number]  HPI 65 year old for follow-up of OSA He has AF , sp MVR 2002 He wears CPAP during the week, and uses a dental appliance on weekends when he travels to [place]. He feels that he has been doing very well with this, and is satisfied with his sleep and daytime alertness  05/09/2016  Chief Complaint  Patient presents with  . Sleep Apnea    Doing well on CPAP. no concerns.    FF Mask ok , pr ok,no problems with DME APS Wt has remained stable download 08/2014 >> about 6 events/hr on average Download 05/2015 >>AHI 4.7/h, good usage, avg pr 14 cm Download 3 months 04/2016 shows good control of events with few centrals, average pressure of 15 cm on auto 5-20 cm, good usage except on weekends when he travels to [place]   Significant tests/ events  NPSG 08/2012:  AHI 30/hr  Review of Systems   Patient denies significant dyspnea,cough, hemoptysis,  chest pain, palpitations, pedal edema, orthopnea, paroxysmal nocturnal dyspnea, lightheadedness, nausea, vomiting, abdominal or  leg pains      Objective:   Physical Exam   Gen. Pleasant, well-nourished, in no distress ENT - no lesions, no post nasal drip Neck: No JVD, no thyromegaly, no carotid bruits Lungs: no use of accessory muscles, no dullness to percussion, clear without rales or rhonchi  Cardiovascular: Rhythm regular, heart sounds  normal, no murmurs or gallops, no peripheral edema Musculoskeletal: No deformities, no cyanosis or clubbing        Assessment & Plan:

## 2016-05-16 ENCOUNTER — Encounter: Payer: Self-pay | Admitting: Pulmonary Disease

## 2016-05-22 ENCOUNTER — Other Ambulatory Visit: Payer: Self-pay | Admitting: Cardiology

## 2016-05-23 NOTE — Telephone Encounter (Signed)
Rx(s) sent to pharmacy electronically.  

## 2016-06-01 ENCOUNTER — Ambulatory Visit (INDEPENDENT_AMBULATORY_CARE_PROVIDER_SITE_OTHER): Payer: BLUE CROSS/BLUE SHIELD | Admitting: *Deleted

## 2016-06-01 DIAGNOSIS — I4891 Unspecified atrial fibrillation: Secondary | ICD-10-CM

## 2016-06-01 DIAGNOSIS — Z5181 Encounter for therapeutic drug level monitoring: Secondary | ICD-10-CM

## 2016-06-01 DIAGNOSIS — Z952 Presence of prosthetic heart valve: Secondary | ICD-10-CM

## 2016-06-01 DIAGNOSIS — Z954 Presence of other heart-valve replacement: Secondary | ICD-10-CM

## 2016-06-01 LAB — POCT INR: INR: 2.5

## 2016-06-07 ENCOUNTER — Other Ambulatory Visit: Payer: Self-pay

## 2016-06-07 NOTE — Telephone Encounter (Signed)
Pt left v/m requesting [facility] day [facility]; pt last seen 03/16/16; in that note it was noted pt goes to coumadin clinic. Is it OK to refill? Last refilled # 30 x 3 on 03/16/16.

## 2016-06-08 MED ORDER — ALLOPURINOL 100 MG PO TABS: 100.0000 mg | ORAL_TABLET | Freq: Every day | ORAL | 1 refills | Status: DC

## 2016-06-29 ENCOUNTER — Other Ambulatory Visit: Payer: BLUE CROSS/BLUE SHIELD | Admitting: *Deleted

## 2016-06-29 ENCOUNTER — Other Ambulatory Visit: Payer: BLUE CROSS/BLUE SHIELD

## 2016-06-29 ENCOUNTER — Ambulatory Visit (INDEPENDENT_AMBULATORY_CARE_PROVIDER_SITE_OTHER): Payer: BLUE CROSS/BLUE SHIELD | Admitting: *Deleted

## 2016-06-29 DIAGNOSIS — I4891 Unspecified atrial fibrillation: Secondary | ICD-10-CM | POA: Diagnosis not present

## 2016-06-29 DIAGNOSIS — Z954 Presence of other heart-valve replacement: Secondary | ICD-10-CM | POA: Diagnosis not present

## 2016-06-29 DIAGNOSIS — Z952 Presence of prosthetic heart valve: Secondary | ICD-10-CM

## 2016-06-29 DIAGNOSIS — Z5181 Encounter for therapeutic drug level monitoring: Secondary | ICD-10-CM

## 2016-06-29 DIAGNOSIS — I10 Essential (primary) hypertension: Secondary | ICD-10-CM

## 2016-06-29 DIAGNOSIS — I480 Paroxysmal atrial fibrillation: Secondary | ICD-10-CM

## 2016-06-29 LAB — LIPID PANEL
CHOLESTEROL: 183 mg/dL (ref 125–200)
HDL: 58 mg/dL (ref >=40)
LDL CALC: 105 mg/dL (ref <130)
Total CHOL/HDL Ratio: 3.2 Ratio (ref <=5.0)
Triglycerides: 100 mg/dL (ref <150)
VLDL: 20 mg/dL (ref <30)

## 2016-06-29 LAB — HEPATIC FUNCTION PANEL
ALT: 15 U/L (ref 9–46)
AST: 22 U/L (ref 10–35)
Albumin: 4 g/dL (ref 3.6–5.1)
Alkaline Phosphatase: 56 U/L (ref 40–115)
BILIRUBIN DIRECT: 0.2 mg/dL (ref <=0.2)
BILIRUBIN INDIRECT: 0.5 mg/dL (ref 0.2–1.2)
Total Bilirubin: 0.7 mg/dL (ref 0.2–1.2)
Total Protein: 6.3 g/dL (ref 6.1–8.1)

## 2016-06-29 LAB — POCT INR: INR: 1.9

## 2016-07-06 ENCOUNTER — Other Ambulatory Visit: Payer: Self-pay | Admitting: Cardiology

## 2016-07-06 DIAGNOSIS — Z952 Presence of prosthetic heart valve: Secondary | ICD-10-CM

## 2016-07-06 DIAGNOSIS — E785 Hyperlipidemia, unspecified: Secondary | ICD-10-CM

## 2016-07-06 DIAGNOSIS — I4891 Unspecified atrial fibrillation: Secondary | ICD-10-CM

## 2016-07-06 DIAGNOSIS — I429 Cardiomyopathy, unspecified: Secondary | ICD-10-CM

## 2016-07-07 NOTE — Telephone Encounter (Signed)
Rx(s) sent to pharmacy electronically.  

## 2016-07-12 ENCOUNTER — Other Ambulatory Visit: Payer: Self-pay | Admitting: Family Medicine

## 2016-07-27 ENCOUNTER — Ambulatory Visit (INDEPENDENT_AMBULATORY_CARE_PROVIDER_SITE_OTHER): Payer: BLUE CROSS/BLUE SHIELD | Admitting: *Deleted

## 2016-07-27 DIAGNOSIS — Z952 Presence of prosthetic heart valve: Secondary | ICD-10-CM

## 2016-07-27 DIAGNOSIS — I4891 Unspecified atrial fibrillation: Secondary | ICD-10-CM | POA: Diagnosis not present

## 2016-07-27 DIAGNOSIS — Z5181 Encounter for therapeutic drug level monitoring: Secondary | ICD-10-CM

## 2016-07-27 LAB — POCT INR: INR: 2.9

## 2016-08-17 ENCOUNTER — Other Ambulatory Visit: Payer: Self-pay | Admitting: Cardiology

## 2016-08-17 DIAGNOSIS — I4891 Unspecified atrial fibrillation: Secondary | ICD-10-CM

## 2016-08-24 ENCOUNTER — Ambulatory Visit (INDEPENDENT_AMBULATORY_CARE_PROVIDER_SITE_OTHER): Payer: BLUE CROSS/BLUE SHIELD | Admitting: *Deleted

## 2016-08-24 DIAGNOSIS — I4891 Unspecified atrial fibrillation: Secondary | ICD-10-CM | POA: Diagnosis not present

## 2016-08-24 DIAGNOSIS — Z952 Presence of prosthetic heart valve: Secondary | ICD-10-CM | POA: Diagnosis not present

## 2016-08-24 DIAGNOSIS — Z5181 Encounter for therapeutic drug level monitoring: Secondary | ICD-10-CM

## 2016-08-24 LAB — POCT INR: INR: 1.9

## 2016-09-21 ENCOUNTER — Ambulatory Visit (INDEPENDENT_AMBULATORY_CARE_PROVIDER_SITE_OTHER): Payer: BLUE CROSS/BLUE SHIELD | Admitting: *Deleted

## 2016-09-21 DIAGNOSIS — Z952 Presence of prosthetic heart valve: Secondary | ICD-10-CM | POA: Diagnosis not present

## 2016-09-21 DIAGNOSIS — I4891 Unspecified atrial fibrillation: Secondary | ICD-10-CM

## 2016-09-21 DIAGNOSIS — Z5181 Encounter for therapeutic drug level monitoring: Secondary | ICD-10-CM | POA: Diagnosis not present

## 2016-09-21 LAB — POCT INR: INR: 2.1

## 2016-10-05 ENCOUNTER — Ambulatory Visit (INDEPENDENT_AMBULATORY_CARE_PROVIDER_SITE_OTHER): Payer: BLUE CROSS/BLUE SHIELD | Admitting: *Deleted

## 2016-10-05 DIAGNOSIS — Z5181 Encounter for therapeutic drug level monitoring: Secondary | ICD-10-CM | POA: Diagnosis not present

## 2016-10-05 DIAGNOSIS — Z952 Presence of prosthetic heart valve: Secondary | ICD-10-CM

## 2016-10-05 DIAGNOSIS — I4891 Unspecified atrial fibrillation: Secondary | ICD-10-CM

## 2016-10-05 LAB — POCT INR: INR: 1.9

## 2016-10-26 ENCOUNTER — Ambulatory Visit (INDEPENDENT_AMBULATORY_CARE_PROVIDER_SITE_OTHER): Payer: BLUE CROSS/BLUE SHIELD | Admitting: *Deleted

## 2016-10-26 DIAGNOSIS — Z952 Presence of prosthetic heart valve: Secondary | ICD-10-CM | POA: Diagnosis not present

## 2016-10-26 DIAGNOSIS — I4891 Unspecified atrial fibrillation: Secondary | ICD-10-CM | POA: Diagnosis not present

## 2016-10-26 DIAGNOSIS — Z5181 Encounter for therapeutic drug level monitoring: Secondary | ICD-10-CM

## 2016-10-26 LAB — POCT INR: INR: 2.4

## 2016-11-04 ENCOUNTER — Encounter: Payer: Self-pay | Admitting: Family Medicine

## 2016-11-04 ENCOUNTER — Ambulatory Visit (INDEPENDENT_AMBULATORY_CARE_PROVIDER_SITE_OTHER): Payer: BLUE CROSS/BLUE SHIELD | Admitting: Family Medicine

## 2016-11-04 DIAGNOSIS — Z20828 Contact with and (suspected) exposure to other viral communicable diseases: Secondary | ICD-10-CM | POA: Diagnosis not present

## 2016-11-04 MED ORDER — OSELTAMIVIR PHOSPHATE 75 MG PO CAPS: 75.0000 mg | ORAL_CAPSULE | Freq: Two times a day (BID) | ORAL | 0 refills | Status: DC

## 2016-11-04 NOTE — Progress Notes (Signed)
Still on coumadin.   Sx stared about 1 week ago.  Was in [place].  Had a cough and sniffles.  "I was good; I thought it was a cold."   No fevers, no vomiting.  Some cough, better now.  Wife was dx'd with flu recently.  No aches.    He had a flu shot last month.    R side of scalp with itchy patch.  He thinks CPAP strap is irritating it locally.    He asked about shingles shot, see AVS.   Meds, vitals, and allergies reviewed.   ROS: Per HPI unless specifically indicated in ROS section   nad ncat TM wnl Nasal and OP exam wnl except for small scabbed area in R nostril (happens to patient frequently with cold dry air/coumadin/CPAP- d/w pt, no intervention needed) Neck supple, no LA IRR, audible click from heart valve surgery at baseline.   ctab abd soft, not ttp Ext w/o edema Linear irritation on the scalp on R side. Doesn't look infected.

## 2016-11-04 NOTE — Patient Instructions (Addendum)
Check with your insurance to see if they will cover the shingles shot. Hold rx for tamiflu.  Use hydrocortisone if needed for the skin irritation.  Take care.  Glad to see you.

## 2016-11-04 NOTE — Progress Notes (Signed)
Pre visit review using our clinic review tool, if applicable. No additional management support is needed unless otherwise documented below in the visit note. 

## 2016-11-04 NOTE — Assessment & Plan Note (Signed)
Well appearing, no sig sx now, hold tamiflu rx.  Start if flu like sx.  He agrees.  Incidental skin irritation possibly from CPAP mask.  Doesn't  Look infected.  Can use prn otc hydrocortisone and f/u prn.   See AVS re: zostavax.  He agrees.

## 2016-11-07 ENCOUNTER — Telehealth: Payer: Self-pay

## 2016-11-07 MED ORDER — PREDNISONE 20 MG PO TABS: ORAL_TABLET | ORAL | 0 refills | Status: DC

## 2016-11-07 NOTE — Telephone Encounter (Signed)
Patient notified

## 2016-11-07 NOTE — Telephone Encounter (Signed)
Pt left v/m; pt has appt on 11/08/16 at 11:15 with Dr Reece Agar. Pt wants to know if could get antiinflammatory med prior to appt to help with swelling of rt knee.[facility].

## 2016-11-07 NOTE — Telephone Encounter (Signed)
Prednisone steroid course sent to pharmacy. Will see him tomorrow.

## 2016-11-08 ENCOUNTER — Ambulatory Visit (INDEPENDENT_AMBULATORY_CARE_PROVIDER_SITE_OTHER): Payer: BLUE CROSS/BLUE SHIELD | Admitting: Family Medicine

## 2016-11-08 ENCOUNTER — Encounter: Payer: Self-pay | Admitting: Family Medicine

## 2016-11-08 VITALS — BP 128/70 | HR 88 | Temp 98.0°F | Wt 192.0 lb

## 2016-11-08 DIAGNOSIS — S8991XA Unspecified injury of right lower leg, initial encounter: Secondary | ICD-10-CM

## 2016-11-08 MED ORDER — DICLOFENAC SODIUM 1 % TD GEL: 1.0000 "application " | Freq: Two times a day (BID) | TRANSDERMAL | 1 refills | Status: DC

## 2016-11-08 NOTE — Assessment & Plan Note (Signed)
Anticipate knee strain with marked effusion. No signs of torn ligament or fracture. Treat supportively with ice, elevation, compression, and voltaren gel. Improvement noted over 24 hours. Update if not continuing to improve over time. Pt agrees with plan.  Xray not done today.

## 2016-11-08 NOTE — Patient Instructions (Addendum)
Stop prednisone.  Treat knee swelling with voltaren gel, may continue biofreeze.  Keep leg elevated. Use knee brace.  Should continue to improve with time.  Let us know if not improving as expected.

## 2016-11-08 NOTE — Progress Notes (Signed)
BP 128/70   Pulse 88   Temp 98 F (36.7 C) (Oral)   Wt 192 lb (87.1 kg)   BMI 27.55 kg/m    CC: R knee Subjective:    Patient ID: Ian Bowen, male    DOB: [birth year], 66 y.o.   MRN: [id number]  HPI: Ian Bowen is a 66 y.o. male presenting on 11/08/2016 for Knee Pain (right; slipped on the ice)   Saw Dr Para March last week for flu exposure (wife), prescribed tamiflu to take if sxs developed. No symptoms.   Fall Sunday in the ice and twisted knee. Didn't land on knee. Knee swelling started the next day. Treating at home with biofreeze and tylenol. Stayed home yesterday. Using crutches. Today swelling seems better. No locking in place of knee, no knee instability with walking. Marked swelling.   H/o R kneecap surgery at age 86yo  On coumadin.   We started allopurinol 100mg  daily last physical for recurrent gout flares. No flare since then. At that time, colchicine was not covered and mitigare was not effective.   Relevant past medical, surgical, family and social history reviewed and updated as indicated. Interim medical history since our last visit reviewed. Allergies and medications reviewed and updated. Current Outpatient Prescriptions on File Prior to Visit  Medication Sig  . acetaminophen (TYLENOL) 500 MG tablet Take 1,000 mg by mouth every 6 (six) hours as needed for moderate pain.  Marland Kitchen allopurinol (ZYLOPRIM) 100 MG tablet TAKE 1 TABLET (100 MG TOTAL) BY MOUTH DAILY.  Marland Kitchen aspirin 81 MG tablet Take 81 mg by mouth at bedtime.   . colchicine (COLCRYS) 0.6 MG tablet Take 1 tablet (0.6 mg total) by mouth daily as needed.  . furosemide (LASIX) 20 MG tablet TAKE 1 TABLET DAILY  . lisinopril (PRINIVIL,ZESTRIL) 5 MG tablet TAKE 1 TABLET DAILY  . lovastatin (MEVACOR) 10 MG tablet Take 1 tablet (10 mg total) by mouth at bedtime.  . metoprolol succinate (TOPROL-XL) 100 MG 24 hr tablet TAKE 1 TABLET TWICE A DAY. TAKE WITH OR IMMEDIATELY FOLLOWING A MEAL.  Marland Kitchen omeprazole (PRILOSEC) 10 MG  capsule TAKE 1 CAPSULE DAILY  . predniSONE (DELTASONE) 20 MG tablet Take two tablets daily for 3 days followed by one tablet daily for 4 days  . tadalafil (CIALIS) 5 MG tablet Take 5 mg by mouth daily as needed.  . vitamin C (ASCORBIC ACID) 500 MG tablet Take 1 tablet (500 mg total) by mouth daily.  Marland Kitchen warfarin (COUMADIN) 5 MG tablet TAKE AS DIRECTED BY ANTICOAGULATION CLINIC   No current facility-administered medications on file prior to visit.     Review of Systems Per HPI unless specifically indicated in ROS section     Objective:    BP 128/70   Pulse 88   Temp 98 F (36.7 C) (Oral)   Wt 192 lb (87.1 kg)   BMI 27.55 kg/m   Wt Readings from Last 3 Encounters:  11/08/16 192 lb (87.1 kg)  11/04/16 192 lb (87.1 kg)  05/09/16 178 lb 3.2 oz (80.8 kg)    Physical Exam  Constitutional: He appears well-developed and well-nourished. No distress.  Musculoskeletal: Normal range of motion. He exhibits edema.  L knee WNL R Knee exam: No significant pain with palpation of knee landmarks. Marked effusion/swelling of right knee LROM in flex due to swelling, full extension without pain, mild tightness No popliteal fullness. Neg drawer test. Neg mcmurray test. No pain with valgus/varus stress. No PFgrind. Tight patella due to swelling  Skin:  Skin is warm and dry. No rash noted. No erythema.  Nursing note and vitals reviewed.  Results for orders placed or performed in visit on 10/26/16  POCT INR  Result Value Ref Range   INR 2.4       Assessment & Plan:   Problem List Items Addressed This Visit    Right knee injury - Primary    Anticipate knee strain with marked effusion. No signs of torn ligament or fracture. Treat supportively with ice, elevation, compression, and voltaren gel. Improvement noted over 24 hours. Update if not continuing to improve over time. Pt agrees with plan.  Xray not done today.           Follow up plan: No Follow-up on file.  Ian Boyden, MD

## 2016-11-08 NOTE — Progress Notes (Signed)
Pre visit review using our clinic review tool, if applicable. No additional management support is needed unless otherwise documented below in the visit note. 

## 2016-11-16 ENCOUNTER — Ambulatory Visit (INDEPENDENT_AMBULATORY_CARE_PROVIDER_SITE_OTHER): Payer: BLUE CROSS/BLUE SHIELD | Admitting: *Deleted

## 2016-11-16 DIAGNOSIS — Z952 Presence of prosthetic heart valve: Secondary | ICD-10-CM

## 2016-11-16 DIAGNOSIS — I4891 Unspecified atrial fibrillation: Secondary | ICD-10-CM | POA: Diagnosis not present

## 2016-11-16 DIAGNOSIS — Z5181 Encounter for therapeutic drug level monitoring: Secondary | ICD-10-CM

## 2016-11-16 LAB — PROTIME-INR
INR: 5 — AB (ref 0.8–1.2)
PROTHROMBIN TIME: 49.8 s — AB (ref 9.1–12.0)

## 2016-11-16 LAB — POCT INR: INR: 6.3

## 2016-11-23 ENCOUNTER — Ambulatory Visit (INDEPENDENT_AMBULATORY_CARE_PROVIDER_SITE_OTHER): Payer: BLUE CROSS/BLUE SHIELD | Admitting: *Deleted

## 2016-11-23 DIAGNOSIS — Z952 Presence of prosthetic heart valve: Secondary | ICD-10-CM

## 2016-11-23 DIAGNOSIS — Z5181 Encounter for therapeutic drug level monitoring: Secondary | ICD-10-CM

## 2016-11-23 DIAGNOSIS — I4891 Unspecified atrial fibrillation: Secondary | ICD-10-CM

## 2016-11-23 LAB — POCT INR: INR: 2.4

## 2016-12-14 ENCOUNTER — Ambulatory Visit (INDEPENDENT_AMBULATORY_CARE_PROVIDER_SITE_OTHER): Payer: BLUE CROSS/BLUE SHIELD | Admitting: *Deleted

## 2016-12-14 DIAGNOSIS — I4891 Unspecified atrial fibrillation: Secondary | ICD-10-CM | POA: Diagnosis not present

## 2016-12-14 DIAGNOSIS — Z5181 Encounter for therapeutic drug level monitoring: Secondary | ICD-10-CM | POA: Diagnosis not present

## 2016-12-14 DIAGNOSIS — Z952 Presence of prosthetic heart valve: Secondary | ICD-10-CM | POA: Diagnosis not present

## 2016-12-14 LAB — POCT INR: INR: 3.2

## 2016-12-24 ENCOUNTER — Other Ambulatory Visit: Payer: Self-pay | Admitting: Family Medicine

## 2017-01-09 ENCOUNTER — Ambulatory Visit (INDEPENDENT_AMBULATORY_CARE_PROVIDER_SITE_OTHER): Payer: BLUE CROSS/BLUE SHIELD | Admitting: *Deleted

## 2017-01-09 DIAGNOSIS — I4891 Unspecified atrial fibrillation: Secondary | ICD-10-CM | POA: Diagnosis not present

## 2017-01-09 DIAGNOSIS — Z5181 Encounter for therapeutic drug level monitoring: Secondary | ICD-10-CM | POA: Diagnosis not present

## 2017-01-09 DIAGNOSIS — Z952 Presence of prosthetic heart valve: Secondary | ICD-10-CM | POA: Diagnosis not present

## 2017-01-09 LAB — POCT INR: INR: 3.3

## 2017-01-20 LAB — COMPREHENSIVE METABOLIC PANEL
ALK PHOS: 78 U/L
ALT: 15
AST: 27 U/L
Creat: 0.79
GLUCOSE: 83
LDH: 522 U/L
Potassium: 4.5 mmol/L
Sodium: 139
Total Bilirubin: 0.6 mg/dL
Uric Acid: 6.8

## 2017-01-20 LAB — PSA: PSA: 1.1

## 2017-01-20 LAB — CBC
HGB: 14.5 g/dL
WBC: 5.1
platelet count: 289

## 2017-01-20 LAB — LIPID PANEL
Cholesterol: 232
HDL: 66
LDL (calc): 153
TRIGLYCERIDES: 66

## 2017-02-08 ENCOUNTER — Encounter: Payer: Self-pay | Admitting: *Deleted

## 2017-02-08 ENCOUNTER — Ambulatory Visit (INDEPENDENT_AMBULATORY_CARE_PROVIDER_SITE_OTHER): Payer: BLUE CROSS/BLUE SHIELD | Admitting: *Deleted

## 2017-02-08 DIAGNOSIS — Z5181 Encounter for therapeutic drug level monitoring: Secondary | ICD-10-CM

## 2017-02-08 DIAGNOSIS — Z952 Presence of prosthetic heart valve: Secondary | ICD-10-CM

## 2017-02-08 DIAGNOSIS — I4891 Unspecified atrial fibrillation: Secondary | ICD-10-CM

## 2017-02-08 LAB — POCT INR: INR: 2.5

## 2017-03-02 ENCOUNTER — Telehealth: Payer: Self-pay | Admitting: Cardiology

## 2017-03-02 NOTE — Telephone Encounter (Signed)
No need for further labs Olga Millers

## 2017-03-02 NOTE — Telephone Encounter (Signed)
Patient calling, would like to know if he needs to have blood work completed before appointment on 03-09-17. Thanks.

## 2017-03-02 NOTE — Telephone Encounter (Signed)
Looks like pt had recent CMET, CBC, lipids 1 month ago. Please advise if any additional labwork recommended before next week's visit.

## 2017-03-03 NOTE — Telephone Encounter (Signed)
Pt notified no labs needed for appt

## 2017-03-06 NOTE — Progress Notes (Signed)
HPI: FU cardiomyopathy and atrial fibrilllation. Patient underwent mitral valve replacement with a [employer] bileaflet valve in September of 2002. Note preoperative cardiac catheterization showed normal coronary arteries and normal LV function. He apparently had subsequent repair in 2006 due to paravalvular leak. Transesophageal echocardiogram in May 2009 showed normally functioning mechanical mitral valve with no leak. LV function was normal. Patient also has a history of paroxysmal atrial fibrillation. He was seen at [facility] in September of 2012 with an episode of atrial fibrillation. Cardizem was added to his medical regimen as was Lasix and potassium. A Myoview was performed in September of 2012 and showed inferior apical infarct with minimal reversibility. Ejection fraction 18%. Echocardiogram in September of 2012 showed an ejection fraction of 20%, diffuse hypokinesis, normally functioning prosthetic mitral valve, biatrial enlargement, right ventricular enlargement with decreased function. We felt his cardiomyopathy may be tachycardia mediated vs ETOH. Holter in Oct 2012 showed his rate mildly increased and we increased his toprol. Echo repeated in July 2014 and showed EF 50-55. Prosthetic MV with mean gradient 9 mmHg, biatrial enlargement, oscillating density in LV cavity most likely related to residual MV apparatus. Since I last saw him, the patient denies any dyspnea on exertion, orthopnea, PND, pedal edema, palpitations, syncope or chest pain.   Current Outpatient Prescriptions  Medication Sig Dispense Refill  . acetaminophen (TYLENOL) 500 MG tablet Take 1,000 mg by mouth every 6 (six) hours as needed for moderate pain.    Marland Kitchen allopurinol (ZYLOPRIM) 100 MG tablet TAKE 1 TABLET (100 MG TOTAL) BY MOUTH DAILY. 30 tablet 0  . aspirin 81 MG tablet Take 81 mg by mouth at bedtime.     . colchicine (COLCRYS) 0.6 MG tablet Take 1 tablet (0.6 mg total) by mouth daily as needed.    .  diclofenac sodium (VOLTAREN) 1 % GEL Apply 1 application topically 2 (two) times daily. 1 Tube 1  . furosemide (LASIX) 20 MG tablet TAKE 1 TABLET DAILY 90 tablet 3  . lisinopril (PRINIVIL,ZESTRIL) 5 MG tablet TAKE 1 TABLET DAILY 90 tablet 4  . lovastatin (MEVACOR) 10 MG tablet Take 1 tablet (10 mg total) by mouth at bedtime. 30 tablet 6  . metoprolol succinate (TOPROL-XL) 100 MG 24 hr tablet TAKE 1 TABLET TWICE A DAY. TAKE WITH OR IMMEDIATELY FOLLOWING A MEAL. 180 tablet 2  . omeprazole (PRILOSEC) 10 MG capsule TAKE 1 CAPSULE DAILY 90 capsule 3  . predniSONE (DELTASONE) 20 MG tablet Take two tablets daily for 3 days followed by one tablet daily for 4 days 10 tablet 0  . tadalafil (CIALIS) 5 MG tablet Take 5 mg by mouth daily as needed.    . vitamin C (ASCORBIC ACID) 500 MG tablet Take 1 tablet (500 mg total) by mouth daily.    Marland Kitchen warfarin (COUMADIN) 5 MG tablet TAKE AS DIRECTED BY ANTICOAGULATION CLINIC 120 tablet 1   No current facility-administered medications for this visit.      Past Medical History:  Diagnosis Date  . Atrial fibrillation, permanent (HCC)   . Cardiomyopathy    per echo  05-07-2013   ef  50%  . CHF (congestive heart failure) (HCC)    monitored by dr Jens Som--  euvolemic  . ED (erectile dysfunction)   . GERD (gastroesophageal reflux disease)   . HTN (hypertension)   . Hyperlipidemia   . OSA on CPAP 2013   moderate OSA PER STUDY  09-02-2012  W/ DR CLANCE  . Prolapsed internal hemorrhoids, grade  3   . S/P MVR (mitral valve replacement)    SEPT  2002   IN   [place]    Past Surgical History:  Procedure Laterality Date  . AMPUTATION FINGER / THUMB  1969   left ring finger  . CARDIAC CATHETERIZATION  05-03-2001     NORMAL CORONARIES/  NORMAL LVF WITH LV MILDY DILATED/  SEVERE MITRAL INSUFFICIENCY  . CARDIOVASCULAR STRESS TEST  07-15-2011   DR Minda Faas   SEVERE LV SYSTOLIC DYSFUNCTION WITH INFEROAPICAL SCAR WITH MINIMAL REVERSIBILITY/   EF 18%  . CARDIOVERSION   2006  . COLONOSCOPY  2011   per pt report normal  . KNEE ARTHROSCOPY Left   . KNEE SURGERY Right 1967   loose kneecap surgery  . MITRAL VALVE REPAIR  2006   ( in [place])   secondary to paravalvular leak  . MITRAL VALVE REPLACEMENT  SEPT  2002   ([facility] [place])   [employer] bileaflet percardial valve  . retinal surx     OD  . TRANSTHORACIC ECHOCARDIOGRAM  05-07-2013    DR Eleisha Branscomb   MILD LVH/  EF  50%/  OSCILLATING DENSITY IN LV CAVITY MOST LIKELY RESIDUAL MV APPARATUS FROM PREVIOUS MVR,  MILDLY ELEVATED MEAN GRADIENT OF 74mmHg/  SEVERE LAD/  MODERATE  RAD/  RV  MILD DILATED/   MILD   TR/  TRIVIAL  PR    Social History   Social History  . Marital status: Single    Spouse name: N/A  . Number of children: 2  . Years of education: N/A   Occupational History  .  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 Smoking status: Never Smoker  . Smokeless tobacco: Never Used  . Alcohol use 6.0 oz/week    4 Glasses of wine, 6 Cans of beer per week     Comment: previously wine/beer (bottle wine nightly).  stopped with results of echo- restarted.  . Drug use: No  . Sexual activity: Not on file   Other Topics Concern  . Not on file   Social History Narrative   Caffeine: 2 cups soda/day   Lives alone, no pets, s/p 2 divorces. Wife lives in [place].   Occupation: Regulatory affairs officer for [employer]   Activity: stays active at work, no regular exercise   Diet: some salads, trying to eat more healthy    Family History  Problem Relation Age of Onset  . Heart attack Father        died of MI at age 54  . Coronary artery disease Father   . Cancer Mother 84       spleen cancer  . Coronary artery disease Paternal Uncle   . Coronary artery disease Paternal Grandfather   . Stroke Neg Hx   . Diabetes Neg Hx     ROS: no fevers or chills, productive cough, hemoptysis, dysphasia, odynophagia, melena, hematochezia, dysuria, hematuria, rash, seizure activity, orthopnea, PND, pedal edema,  claudication. Remaining systems are negative.  Physical Exam: Well-developed well-nourished in no acute distress.  Skin is warm and dry.  HEENT is normal.  Neck is supple. No bruits Chest is clear to auscultation with normal expansion.  Cardiovascular exam is irregular, crisp mechanical valve sounds Abdominal exam nontender or distended. No masses palpated. Extremities show no edema. neuro grossly intact  ECG- atrial fibrillation at a rate of 95. personally reviewed  A/P  1 Permanent atrial fibrillation-continue beta blocker for rate control; continue coumadin.  2 Cardiomyopathy-improved on most  recent echo; plan repeat study. Continue beta blocker and ACEI.  3 Status post mitral valve replacement- Continue SBE prophylaxis. Continue aspirin and Coumadin.  4 chronic diastolic congestive heart failure-patient appears to be euvolemic on examination. Continue present dose of Lasix.  5 hyperlipidemia-management per primary care.  6 hypertension-blood pressure elevated. However he states typically controlled. Continue present medications and follow; increase lisinopril as needed.  Olga Millers, MD

## 2017-03-09 ENCOUNTER — Ambulatory Visit (INDEPENDENT_AMBULATORY_CARE_PROVIDER_SITE_OTHER): Payer: BLUE CROSS/BLUE SHIELD | Admitting: Pharmacist Clinician (PhC)/ Clinical Pharmacy Specialist

## 2017-03-09 ENCOUNTER — Ambulatory Visit (INDEPENDENT_AMBULATORY_CARE_PROVIDER_SITE_OTHER): Payer: BLUE CROSS/BLUE SHIELD | Admitting: Cardiology

## 2017-03-09 ENCOUNTER — Encounter: Payer: Self-pay | Admitting: Cardiology

## 2017-03-09 VITALS — BP 130/96 | HR 95 | Ht 70.0 in | Wt 196.0 lb

## 2017-03-09 DIAGNOSIS — I4891 Unspecified atrial fibrillation: Secondary | ICD-10-CM

## 2017-03-09 DIAGNOSIS — Z952 Presence of prosthetic heart valve: Secondary | ICD-10-CM | POA: Diagnosis not present

## 2017-03-09 DIAGNOSIS — I5032 Chronic diastolic (congestive) heart failure: Secondary | ICD-10-CM | POA: Diagnosis not present

## 2017-03-09 DIAGNOSIS — I482 Chronic atrial fibrillation: Secondary | ICD-10-CM | POA: Diagnosis not present

## 2017-03-09 DIAGNOSIS — Z5181 Encounter for therapeutic drug level monitoring: Secondary | ICD-10-CM | POA: Diagnosis not present

## 2017-03-09 DIAGNOSIS — I429 Cardiomyopathy, unspecified: Secondary | ICD-10-CM

## 2017-03-09 DIAGNOSIS — E7800 Pure hypercholesterolemia, unspecified: Secondary | ICD-10-CM | POA: Diagnosis not present

## 2017-03-09 DIAGNOSIS — E785 Hyperlipidemia, unspecified: Secondary | ICD-10-CM | POA: Diagnosis not present

## 2017-03-09 DIAGNOSIS — I10 Essential (primary) hypertension: Secondary | ICD-10-CM

## 2017-03-09 DIAGNOSIS — I480 Paroxysmal atrial fibrillation: Secondary | ICD-10-CM

## 2017-03-09 DIAGNOSIS — I4821 Permanent atrial fibrillation: Secondary | ICD-10-CM

## 2017-03-09 LAB — POCT INR: INR: 2.8

## 2017-03-09 MED ORDER — METOPROLOL SUCCINATE ER 100 MG PO TB24: 100.0000 mg | ORAL_TABLET | Freq: Every day | ORAL | 3 refills | Status: AC

## 2017-03-09 MED ORDER — LOVASTATIN 10 MG PO TABS: 10.0000 mg | ORAL_TABLET | Freq: Every day | ORAL | 3 refills | Status: AC

## 2017-03-09 MED ORDER — OMEPRAZOLE 10 MG PO CPDR: 10.0000 mg | DELAYED_RELEASE_CAPSULE | Freq: Every day | ORAL | 3 refills | Status: AC

## 2017-03-09 MED ORDER — FUROSEMIDE 20 MG PO TABS: 20.0000 mg | ORAL_TABLET | Freq: Every day | ORAL | 3 refills | Status: AC

## 2017-03-09 MED ORDER — LISINOPRIL 5 MG PO TABS
5.0000 mg | ORAL_TABLET | Freq: Every day | ORAL | 3 refills | Status: AC
Start: 2017-03-09 — End: ?

## 2017-03-09 NOTE — Patient Instructions (Signed)

## 2017-03-12 ENCOUNTER — Other Ambulatory Visit: Payer: Self-pay | Admitting: Family Medicine

## 2017-03-15 ENCOUNTER — Other Ambulatory Visit: Payer: BLUE CROSS/BLUE SHIELD

## 2017-03-22 ENCOUNTER — Encounter: Payer: Self-pay | Admitting: Family Medicine

## 2017-03-22 ENCOUNTER — Ambulatory Visit (INDEPENDENT_AMBULATORY_CARE_PROVIDER_SITE_OTHER): Payer: BLUE CROSS/BLUE SHIELD | Admitting: Family Medicine

## 2017-03-22 VITALS — BP 116/70 | HR 67 | Temp 98.1°F | Ht 70.0 in | Wt 198.0 lb

## 2017-03-22 DIAGNOSIS — R740 Nonspecific elevation of levels of transaminase and lactic acid dehydrogenase [LDH]: Secondary | ICD-10-CM | POA: Diagnosis not present

## 2017-03-22 DIAGNOSIS — Z23 Encounter for immunization: Secondary | ICD-10-CM

## 2017-03-22 DIAGNOSIS — M1A0790 Idiopathic chronic gout, unspecified ankle and foot, without tophus (tophi): Secondary | ICD-10-CM

## 2017-03-22 DIAGNOSIS — I482 Chronic atrial fibrillation: Secondary | ICD-10-CM | POA: Diagnosis not present

## 2017-03-22 DIAGNOSIS — E785 Hyperlipidemia, unspecified: Secondary | ICD-10-CM | POA: Diagnosis not present

## 2017-03-22 DIAGNOSIS — I509 Heart failure, unspecified: Secondary | ICD-10-CM | POA: Diagnosis not present

## 2017-03-22 DIAGNOSIS — Z7189 Other specified counseling: Secondary | ICD-10-CM | POA: Diagnosis not present

## 2017-03-22 DIAGNOSIS — I4821 Permanent atrial fibrillation: Secondary | ICD-10-CM

## 2017-03-22 DIAGNOSIS — E7800 Pure hypercholesterolemia, unspecified: Secondary | ICD-10-CM

## 2017-03-22 DIAGNOSIS — Z952 Presence of prosthetic heart valve: Secondary | ICD-10-CM | POA: Diagnosis not present

## 2017-03-22 DIAGNOSIS — I10 Essential (primary) hypertension: Secondary | ICD-10-CM

## 2017-03-22 DIAGNOSIS — Z0000 Encounter for general adult medical examination without abnormal findings: Secondary | ICD-10-CM | POA: Diagnosis not present

## 2017-03-22 DIAGNOSIS — R7402 Elevation of levels of lactic acid dehydrogenase (LDH): Secondary | ICD-10-CM

## 2017-03-22 MED ORDER — TADALAFIL 5 MG PO TABS: 5.0000 mg | ORAL_TABLET | Freq: Every day | ORAL | 3 refills | Status: AC | PRN

## 2017-03-22 NOTE — Addendum Note (Signed)
Addended by: Alvina Chou on: 03/22/2017 12:38 PM   Modules accepted: Orders

## 2017-03-22 NOTE — Assessment & Plan Note (Signed)
Rate controlled on coumadin and toprol XL.

## 2017-03-22 NOTE — Progress Notes (Addendum)
BP 116/70   Pulse 67   Temp 98.1 F (36.7 C) (Oral)   Ht 5\' 10"  (1.778 m)   Wt 198 lb (89.8 kg)   SpO2 96%   BMI 28.41 kg/m   Waist circumference 41   CC: CPE Subjective:    Patient ID: Ian Bowen, male    DOB: Feb 22, 1951, 66 y.o.   MRN: [id number]  HPI: Ian Bowen is a 66 y.o. male presenting on 03/22/2017 for Annual Exam (form to be fill out) and Medication Refill (Printed Rx for Cialis)   Would like work forms filled out for insurance purposes.  Considering move back to [place] next year. Just got married again. Ongoing knee pain - has seen flexogenic.   Preventative: COLONOSCOPY Date: 2011 per pt report normal. Done in [facility]. states rec rpt 10 yrs. 2nd normal per patient. Prostate - checked yearly, always normal.  Flu shot - doesn't take. Got sick last time he got flu shot  Tdap 2011  prevnar - today shingrix - discussed Advanced planning - discussed. Would like wife Harriett Sine to be HCPOA. Packet provided today.  Seat belt use discussed  Sunscreen use discussed. No changing spots on skin  Non smoker Alcohol - occasional wine  Caffeine: coffee Just got married - considering move back to [place], with part time in [place] (where wife lies)  s/p 2 divorces.  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 Interim medical history since our last visit reviewed. Allergies and medications reviewed and updated. Outpatient Medications Prior to Visit  Medication Sig Dispense Refill  . acetaminophen (TYLENOL) 500 MG tablet Take 1,000 mg by mouth every 6 (six) hours as needed for moderate pain.    Marland Kitchen allopurinol (ZYLOPRIM) 100 MG tablet TAKE 1 TABLET (100 MG TOTAL) BY MOUTH DAILY. 30 tablet 0  . aspirin 81 MG tablet Take 81 mg by  mouth at bedtime.     . colchicine (COLCRYS) 0.6 MG tablet Take 1 tablet (0.6 mg total) by mouth daily as needed.    . diclofenac sodium (VOLTAREN) 1 % GEL Apply 1 application topically 2 (two) times daily. 1 Tube 1  . furosemide (LASIX) 20 MG tablet Take 1 tablet (20 mg total) by mouth daily. 90 tablet 3  . lisinopril (PRINIVIL,ZESTRIL) 5 MG tablet Take 1 tablet (5 mg total) by mouth daily. 90 tablet 3  . lovastatin (MEVACOR) 10 MG tablet Take 1 tablet (10 mg total) by mouth at bedtime. 90 tablet 3  . metoprolol succinate (TOPROL-XL) 100 MG 24 hr tablet Take 1 tablet (100 mg total) by mouth daily. Take with or immediately following a meal. 180 tablet 3  . omeprazole (PRILOSEC) 10 MG capsule Take 1 capsule (10 mg total) by mouth daily. 90 capsule 3  . predniSONE (DELTASONE) 20 MG tablet Take two tablets daily for 3 days followed by one tablet daily for 4 days 10 tablet 0  . vitamin C (ASCORBIC ACID) 500 MG tablet Take 1 tablet (500 mg total) by mouth daily.    Marland Kitchen warfarin (COUMADIN) 5 MG tablet TAKE AS DIRECTED BY ANTICOAGULATION CLINIC 120 tablet 1  . tadalafil (CIALIS) 5 MG tablet Take 5 mg by mouth daily as needed.     No facility-administered medications prior to visit.  Per HPI unless specifically indicated in ROS section below Review of Systems  Constitutional: Negative for activity change, appetite change, chills, fatigue, fever and unexpected weight change.  HENT: Negative for hearing loss.   Eyes: Positive for visual disturbance (R cataract).  Respiratory: Negative for cough, chest tightness, shortness of breath and wheezing.   Cardiovascular: Negative for chest pain, palpitations and leg swelling.  Gastrointestinal: Negative for abdominal distention, abdominal pain, blood in stool, constipation, diarrhea, nausea and vomiting.  Genitourinary: Negative for difficulty urinating and hematuria.  Musculoskeletal: Negative for arthralgias, myalgias and neck pain.  Skin: Negative for  rash.  Neurological: Negative for dizziness, seizures, syncope and headaches.  Hematological: Negative for adenopathy. Does not bruise/bleed easily.  Psychiatric/Behavioral: Negative for dysphoric mood. The patient is not nervous/anxious.        Objective:    BP 116/70   Pulse 67   Temp 98.1 F (36.7 C) (Oral)   Ht 5\' 10"  (1.778 m)   Wt 198 lb (89.8 kg)   SpO2 96%   BMI 28.41 kg/m   Wt Readings from Last 3 Encounters:  03/22/17 198 lb (89.8 kg)  03/09/17 196 lb (88.9 kg)  11/08/16 192 lb (87.1 kg)    Physical Exam  Constitutional: He is oriented to person, place, and time. He appears well-developed and well-nourished. No distress.  HENT:  Head: Normocephalic and atraumatic.  Right Ear: Hearing, tympanic membrane, external ear and ear canal normal.  Left Ear: Hearing, tympanic membrane, external ear and ear canal normal.  Nose: Nose normal.  Mouth/Throat: Uvula is midline, oropharynx is clear and moist and mucous membranes are normal. No oropharyngeal exudate, posterior oropharyngeal edema or posterior oropharyngeal erythema.  Eyes: Conjunctivae and EOM are normal. Pupils are equal, round, and reactive to light. No scleral icterus.  Neck: Normal range of motion. Neck supple. No thyromegaly present.  Cardiovascular: Normal rate, regular rhythm and intact distal pulses.   Murmur (mechanical click) heard. Pulses:      Radial pulses are 2+ on the right side, and 2+ on the left side.  Pulmonary/Chest: Effort normal and breath sounds normal. No respiratory distress. He has no wheezes. He has no rales.  Abdominal: Soft. Bowel sounds are normal. He exhibits no distension and no mass. There is no tenderness. There is no rebound and no guarding.  Genitourinary: Rectum normal and prostate normal. Rectal exam shows no external hemorrhoid, no fissure, no mass, no tenderness and anal tone normal. Prostate is not enlarged (20gm) and not tender.  Musculoskeletal: Normal range of motion. He  exhibits no edema.  Lymphadenopathy:    He has no cervical adenopathy.  Neurological: He is alert and oriented to person, place, and time.  CN grossly intact, station and gait intact  Skin: Skin is warm and dry. No rash noted.  Psychiatric: He has a normal mood and affect. His behavior is normal. Judgment and thought content normal.  Nursing note and vitals reviewed.  Results for orders placed or performed in visit on 03/09/17  POCT INR  Result Value Ref Range   INR 2.8       Assessment & Plan:   Problem List Items Addressed This Visit    Advanced care planning/counseling discussion    Advanced planning - discussed. Would like wife Harriett Sine to be HCPOA. Packet provided today.       Atrial fibrillation (HCC)    Rate controlled on coumadin and toprol XL.       Relevant Medications   tadalafil (CIALIS) 5  MG tablet   CHF (congestive heart failure) (HCC)    Appreciate cards care of patient      Relevant Medications   tadalafil (CIALIS) 5 MG tablet   Elevated LDH    transaminitis resolved with discontinuation of statin, now back on lower dose lovastatin. Persistent elevated LDH - update today. ?turbulent blood flow due to mechanical valve (no anemia however).      Gout    Urate improved, doing well on daily allopurinol - continue.       Healthcare maintenance - Primary    Preventative protocols reviewed and updated unless pt declined. Discussed healthy diet and lifestyle.       Hyperlipidemia    Chronic, stable. Back on lovastatin, lower dose. Catheterization showed no plaque buildup. Will continue lower dose although LDL 153.  The 10-year ASCVD risk score Ian George DC Montez Hageman., et al., 2013) is: 11.9%*   Values used to calculate the score:     Age: 72 years     Sex: Male     Is Non-Hispanic African American: No     Diabetic: No     Tobacco smoker: No     Systolic Blood Pressure: 116 mmHg     Is BP treated: Yes     HDL Cholesterol: 66 mg/dL*     Total Cholesterol: 232  mg/dL*     * - Cholesterol units were assumed for this score calculation       Relevant Medications   tadalafil (CIALIS) 5 MG tablet   Hypertension    Chronic, stable. Continue current regimen.       Relevant Medications   tadalafil (CIALIS) 5 MG tablet   S/P mitral valve replacement       Follow up plan: Return in about 1 year (around 03/22/2018) for annual exam, prior fasting for blood work.  Eustaquio Boyden, MD

## 2017-03-22 NOTE — Assessment & Plan Note (Signed)
Appreciate cards care of patient.  

## 2017-03-22 NOTE — Assessment & Plan Note (Signed)
Chronic, stable. Continue current regimen. 

## 2017-03-22 NOTE — Assessment & Plan Note (Signed)
Advanced planning - discussed. Would like wife Harriett Sine to be HCPOA. Packet provided today.

## 2017-03-22 NOTE — Assessment & Plan Note (Addendum)
transaminitis resolved with discontinuation of statin, now back on lower dose lovastatin. Persistent elevated LDH - update today. ?turbulent blood flow due to mechanical valve (no anemia however).

## 2017-03-22 NOTE — Assessment & Plan Note (Signed)
Preventative protocols reviewed and updated unless pt declined. Discussed healthy diet and lifestyle.  

## 2017-03-22 NOTE — Assessment & Plan Note (Signed)
Urate improved, doing well on daily allopurinol - continue.

## 2017-03-22 NOTE — Addendum Note (Signed)
Addended by: Tawnya Crook on: 03/22/2017 09:27 AM   Modules accepted: Orders

## 2017-03-22 NOTE — Assessment & Plan Note (Signed)
Chronic, stable. Back on lovastatin, lower dose. Catheterization showed no plaque buildup. Will continue lower dose although LDL 153.  The 10-year ASCVD risk score Ian Bowen., et al., 2013) is: 11.9%*   Values used to calculate the score:     Age: 66 years     Sex: Male     Is Non-Hispanic African American: No     Diabetic: No     Tobacco smoker: No     Systolic Blood Pressure: 116 mmHg     Is BP treated: Yes     HDL Cholesterol: 66 mg/dL*     Total Cholesterol: 232 mg/dL*     * - Cholesterol units were assumed for this score calculation

## 2017-03-22 NOTE — Patient Instructions (Addendum)
Prevnar today Advanced directive packet provided today.  Return as needed or in 1 year for next physical.  Health Maintenance, Male A healthy lifestyle and preventive care is important for your health and wellness. Ask your health care provider about what schedule of regular examinations is right for you. What should I know about weight and diet? Eat a Healthy Diet  Eat plenty of vegetables, fruits, whole grains, low-fat dairy products, and lean protein.  Do not eat a lot of foods high in solid fats, added sugars, or salt.  Maintain a Healthy Weight Regular exercise can help you achieve or maintain a healthy weight. You should:  Do at least 150 minutes of exercise each week. The exercise should increase your heart rate and make you sweat (moderate-intensity exercise).  Do strength-training exercises at least twice a week.  Watch Your Levels of Cholesterol and Blood Lipids  Have your blood tested for lipids and cholesterol every 5 years starting at 65 years of age. If you are at high risk for heart disease, you should start having your blood tested when you are 65 years old. You may need to have your cholesterol levels checked more often if: ? Your lipid or cholesterol levels are high. ? You are older than 65 years of age. ? You are at high risk for heart disease.  What should I know about cancer screening? Many types of cancers can be detected early and may often be prevented. Lung Cancer  You should be screened every year for lung cancer if: ? You are a current smoker who has smoked for at least 30 years. ? You are a former smoker who has quit within the past 15 years.  Talk to your health care provider about your screening options, when you should start screening, and how often you should be screened.  Colorectal Cancer  Routine colorectal cancer screening usually begins at 65 years of age and should be repeated every 5-10 years until you are 65 years old. You may need to be  screened more often if early forms of precancerous polyps or small growths are found. Your health care provider may recommend screening at an earlier age if you have risk factors for colon cancer.  Your health care provider may recommend using home test kits to check for hidden blood in the stool.  A small camera at the end of a tube can be used to examine your colon (sigmoidoscopy or colonoscopy). This checks for the earliest forms of colorectal cancer.  Prostate and Testicular Cancer  Depending on your age and overall health, your health care provider may do certain tests to screen for prostate and testicular cancer.  Talk to your health care provider about any symptoms or concerns you have about testicular or prostate cancer.  Skin Cancer  Check your skin from head to toe regularly.  Tell your health care provider about any new moles or changes in moles, especially if: ? There is a change in a mole's size, shape, or color. ? You have a mole that is larger than a pencil eraser.  Always use sunscreen. Apply sunscreen liberally and repeat throughout the day.  Protect yourself by wearing long sleeves, pants, a wide-brimmed hat, and sunglasses when outside.  What should I know about heart disease, diabetes, and high blood pressure?  If you are 18-58 years of age, have your blood pressure checked every 3-5 years. If you are 79 years of age or older, have your blood pressure checked every year.  checked every year. You should have your blood pressure measured twice-once when you are at a hospital or clinic, and once when you are not at a hospital or clinic. Record the average of the two measurements. To check your blood pressure when you are not at a hospital or clinic, you can use: ? An automated blood pressure machine at a pharmacy. ? A home blood pressure monitor.  Talk to your health care provider about your target blood pressure.  If you are between 45-79 years old, ask your health care provider if you  should take aspirin to prevent heart disease.  Have regular diabetes screenings by checking your fasting blood sugar level. ? If you are at a normal weight and have a low risk for diabetes, have this test once every three years after the age of 45. ? If you are overweight and have a high risk for diabetes, consider being tested at a younger age or more often.  A one-time screening for abdominal aortic aneurysm (AAA) by ultrasound is recommended for men aged 65-75 years who are current or former smokers. What should I know about preventing infection? Hepatitis B If you have a higher risk for hepatitis B, you should be screened for this virus. Talk with your health care provider to find out if you are at risk for hepatitis B infection. Hepatitis C Blood testing is recommended for:  Everyone born from 1945 through 1965.  Anyone with known risk factors for hepatitis C.  Sexually Transmitted Diseases (STDs)  You should be screened each year for STDs including gonorrhea and chlamydia if: ? You are sexually active and are younger than 65 years of age. ? You are older than 65 years of age and your health care provider tells you that you are at risk for this type of infection. ? Your sexual activity has changed since you were last screened and you are at an increased risk for chlamydia or gonorrhea. Ask your health care provider if you are at risk.  Talk with your health care provider about whether you are at high risk of being infected with HIV. Your health care provider may recommend a prescription medicine to help prevent HIV infection.  What else can I do?  Schedule regular health, dental, and eye exams.  Stay current with your vaccines (immunizations).  Do not use any tobacco products, such as cigarettes, chewing tobacco, and e-cigarettes. If you need help quitting, ask your health care provider.  Limit alcohol intake to no more than 2 drinks per day. One drink equals 12 ounces of beer,  5 ounces of wine, or 1 ounces of hard liquor.  Do not use street drugs.  Do not share needles.  Ask your health care provider for help if you need support or information about quitting drugs.  Tell your health care provider if you often feel depressed.  Tell your health care provider if you have ever been abused or do not feel safe at home. This information is not intended to replace advice given to you by your health care provider. Make sure you discuss any questions you have with your health care provider. Document Released: 03/31/2008 Document Revised: 06/01/2016 Document Reviewed: 07/07/2015 Elsevier Interactive Patient Education  2018 [employer]  

## 2017-03-23 LAB — LACTATE DEHYDROGENASE: LDH: 428 U/L — ABNORMAL HIGH (ref 120–250)

## 2017-03-25 ENCOUNTER — Encounter: Payer: Self-pay | Admitting: Family Medicine

## 2017-04-05 ENCOUNTER — Ambulatory Visit (INDEPENDENT_AMBULATORY_CARE_PROVIDER_SITE_OTHER): Payer: BLUE CROSS/BLUE SHIELD | Admitting: *Deleted

## 2017-04-05 ENCOUNTER — Other Ambulatory Visit: Payer: Self-pay

## 2017-04-05 ENCOUNTER — Ambulatory Visit (HOSPITAL_COMMUNITY): Payer: BLUE CROSS/BLUE SHIELD | Attending: Internal Medicine

## 2017-04-05 DIAGNOSIS — Z952 Presence of prosthetic heart valve: Secondary | ICD-10-CM

## 2017-04-05 DIAGNOSIS — Z5181 Encounter for therapeutic drug level monitoring: Secondary | ICD-10-CM | POA: Diagnosis not present

## 2017-04-05 DIAGNOSIS — I429 Cardiomyopathy, unspecified: Secondary | ICD-10-CM | POA: Insufficient documentation

## 2017-04-05 DIAGNOSIS — I083 Combined rheumatic disorders of mitral, aortic and tricuspid valves: Secondary | ICD-10-CM | POA: Insufficient documentation

## 2017-04-05 DIAGNOSIS — I4891 Unspecified atrial fibrillation: Secondary | ICD-10-CM | POA: Diagnosis not present

## 2017-04-05 LAB — ECHOCARDIOGRAM COMPLETE
Ao-asc: 31 cm
CHL CUP LVOT MV VTI INDEX: 0.58 cm2/m2
CHL CUP LVOT MV VTI: 1.2
CHL CUP MV M VEL: 134
CHL CUP TV REG PEAK VELOCITY: 254 cm/s
EWDT: 275 ms
FS: 25 % — AB (ref 28–44)
IVS/LV PW RATIO, ED: 1.19
LA ID, A-P, ES: 74 mm
LA diam end sys: 74 mm
LA vol A4C: 216 ml
LA vol: 222 mL
LADIAMINDEX: 3.56 cm/m2
LAVOLIN: 106.7 mL/m2
LDCA: 4.52 cm2
LV PW d: 11.2 mm — AB (ref 0.6–1.1)
LVOT VTI: 11.5 cm
LVOT diameter: 24 mm
LVOT peak grad rest: 2 mmHg
LVOTPV: 69.9 cm/s
LVOTSV: 52 mL
MV Dec: 275
MV pk E vel: 207 m/s
MVANNULUSVTI: 43.4 cm
MVAP: 2.08 cm2
MVPG: 17 mmHg
Mean grad: 9 mmHg
P 1/2 time: 104 ms
TR max vel: 254 cm/s

## 2017-04-05 LAB — POCT INR: INR: 6.3

## 2017-04-12 ENCOUNTER — Ambulatory Visit (INDEPENDENT_AMBULATORY_CARE_PROVIDER_SITE_OTHER): Payer: BLUE CROSS/BLUE SHIELD | Admitting: *Deleted

## 2017-04-12 ENCOUNTER — Other Ambulatory Visit: Payer: Self-pay | Admitting: Cardiology

## 2017-04-12 DIAGNOSIS — Z5181 Encounter for therapeutic drug level monitoring: Secondary | ICD-10-CM

## 2017-04-12 DIAGNOSIS — I4891 Unspecified atrial fibrillation: Secondary | ICD-10-CM | POA: Diagnosis not present

## 2017-04-12 DIAGNOSIS — Z952 Presence of prosthetic heart valve: Secondary | ICD-10-CM

## 2017-04-12 LAB — POCT INR: INR: 1.6

## 2017-05-03 ENCOUNTER — Ambulatory Visit (INDEPENDENT_AMBULATORY_CARE_PROVIDER_SITE_OTHER): Payer: BLUE CROSS/BLUE SHIELD | Admitting: *Deleted

## 2017-05-03 DIAGNOSIS — Z952 Presence of prosthetic heart valve: Secondary | ICD-10-CM

## 2017-05-03 DIAGNOSIS — I4891 Unspecified atrial fibrillation: Secondary | ICD-10-CM | POA: Diagnosis not present

## 2017-05-03 DIAGNOSIS — Z5181 Encounter for therapeutic drug level monitoring: Secondary | ICD-10-CM | POA: Diagnosis not present

## 2017-05-03 LAB — POCT INR: INR: 4.9

## 2017-05-15 ENCOUNTER — Encounter: Payer: Self-pay | Admitting: Pulmonary Disease

## 2017-05-16 ENCOUNTER — Encounter: Payer: Self-pay | Admitting: Pulmonary Disease

## 2017-05-16 ENCOUNTER — Ambulatory Visit (INDEPENDENT_AMBULATORY_CARE_PROVIDER_SITE_OTHER): Payer: BLUE CROSS/BLUE SHIELD | Admitting: Pulmonary Disease

## 2017-05-16 DIAGNOSIS — G4733 Obstructive sleep apnea (adult) (pediatric): Secondary | ICD-10-CM | POA: Diagnosis not present

## 2017-05-16 DIAGNOSIS — I10 Essential (primary) hypertension: Secondary | ICD-10-CM

## 2017-05-16 NOTE — Progress Notes (Signed)
   Subjective:    Patient ID: Ian Bowen, male    DOB: 1951-07-18, 66 y.o.   MRN: [id number]  HPI  66 year-old for follow-up of OSA He has AF , sp MVR 2002 He wears CPAP during the week, and uses a dental appliance on weekends when he travels to [place].   Chief Complaint  Patient presents with  . Follow-up    Pt states he is doing well on his cpap machine, he states he uses it every night, he is tolerating his pressure well DME:[facility]   He cannot married, and is thinking of moving to [place], still intends to keep working Continues to travel to [place] on the weekends and uses as dental appliance during his travel. CPAP download shows good control of events on auto 10-15 cm with average pressure of 14.5 cm with minimal leak Usage is good, he denies any problems with mask at pressure, as adjusted to fullface mask He would like a new machine but is willing to wait until he goes on Medicare    Significant tests/ events  NPSG 08/2012: AHI 30/hr   Review of Systems neg for any significant sore throat, dysphagia, itching, sneezing, nasal congestion or excess/ purulent secretions, fever, chills, sweats, unintended wt loss, pleuritic or exertional cp, hempoptysis, orthopnea pnd or change in chronic leg swelling. Also denies presyncope, palpitations, heartburn, abdominal pain, nausea, vomiting, diarrhea or change in bowel or urinary habits, dysuria,hematuria, rash, arthralgias, visual complaints, headache, numbness weakness or ataxia.     Objective:   Physical Exam  Gen. Pleasant, well-nourished, in no distress ENT - no thrush, no post nasal drip Neck: No JVD, no thyromegaly, no carotid bruits Lungs: no use of accessory muscles, no dullness to percussion, clear without rales or rhonchi  Cardiovascular: Rhythm regular, heart sounds  normal, no murmurs or gallops, no peripheral edema Musculoskeletal: No deformities, no cyanosis or clubbing        Assessment & Plan:

## 2017-05-16 NOTE — Patient Instructions (Signed)
CPAP is working well , on auto 10-15 cm, avg pr is 15 cm

## 2017-05-16 NOTE — Assessment & Plan Note (Signed)
Well-controlled on 2 medications 

## 2017-05-16 NOTE — Assessment & Plan Note (Signed)
Weight loss encouraged, compliance with goal of at least 4-6 hrs every night is the expectation. Advised against medications with sedative side effects Cautioned against driving when sleepy - understanding that sleepiness will vary on a day to day basis  Ct auto 10-15 cm Call for a new machine by next year

## 2017-05-17 ENCOUNTER — Ambulatory Visit (INDEPENDENT_AMBULATORY_CARE_PROVIDER_SITE_OTHER): Payer: BLUE CROSS/BLUE SHIELD | Admitting: *Deleted

## 2017-05-17 DIAGNOSIS — Z5181 Encounter for therapeutic drug level monitoring: Secondary | ICD-10-CM | POA: Diagnosis not present

## 2017-05-17 DIAGNOSIS — I4891 Unspecified atrial fibrillation: Secondary | ICD-10-CM

## 2017-05-17 DIAGNOSIS — Z952 Presence of prosthetic heart valve: Secondary | ICD-10-CM | POA: Diagnosis not present

## 2017-05-17 LAB — POCT INR: INR: 2.7

## 2017-06-14 ENCOUNTER — Ambulatory Visit (INDEPENDENT_AMBULATORY_CARE_PROVIDER_SITE_OTHER): Payer: BLUE CROSS/BLUE SHIELD | Admitting: *Deleted

## 2017-06-14 DIAGNOSIS — I4891 Unspecified atrial fibrillation: Secondary | ICD-10-CM

## 2017-06-14 DIAGNOSIS — Z952 Presence of prosthetic heart valve: Secondary | ICD-10-CM

## 2017-06-14 DIAGNOSIS — Z5181 Encounter for therapeutic drug level monitoring: Secondary | ICD-10-CM

## 2017-06-14 LAB — POCT INR: INR: 4.7

## 2017-06-21 ENCOUNTER — Other Ambulatory Visit: Payer: Self-pay | Admitting: Family Medicine

## 2017-07-05 ENCOUNTER — Ambulatory Visit (INDEPENDENT_AMBULATORY_CARE_PROVIDER_SITE_OTHER): Payer: BLUE CROSS/BLUE SHIELD | Admitting: *Deleted

## 2017-07-05 DIAGNOSIS — Z5181 Encounter for therapeutic drug level monitoring: Secondary | ICD-10-CM | POA: Diagnosis not present

## 2017-07-05 DIAGNOSIS — I4891 Unspecified atrial fibrillation: Secondary | ICD-10-CM

## 2017-07-05 DIAGNOSIS — Z952 Presence of prosthetic heart valve: Secondary | ICD-10-CM | POA: Diagnosis not present

## 2017-07-05 LAB — POCT INR: INR: 3.3

## 2017-08-02 ENCOUNTER — Ambulatory Visit (INDEPENDENT_AMBULATORY_CARE_PROVIDER_SITE_OTHER): Payer: BLUE CROSS/BLUE SHIELD | Admitting: *Deleted

## 2017-08-02 DIAGNOSIS — Z5181 Encounter for therapeutic drug level monitoring: Secondary | ICD-10-CM

## 2017-08-02 DIAGNOSIS — Z952 Presence of prosthetic heart valve: Secondary | ICD-10-CM

## 2017-08-02 DIAGNOSIS — I4891 Unspecified atrial fibrillation: Secondary | ICD-10-CM | POA: Diagnosis not present

## 2017-08-02 LAB — POCT INR: INR: 3.7

## 2017-08-30 ENCOUNTER — Ambulatory Visit (INDEPENDENT_AMBULATORY_CARE_PROVIDER_SITE_OTHER): Payer: BLUE CROSS/BLUE SHIELD | Admitting: *Deleted

## 2017-08-30 DIAGNOSIS — Z952 Presence of prosthetic heart valve: Secondary | ICD-10-CM

## 2017-08-30 DIAGNOSIS — Z5181 Encounter for therapeutic drug level monitoring: Secondary | ICD-10-CM

## 2017-08-30 DIAGNOSIS — I4891 Unspecified atrial fibrillation: Secondary | ICD-10-CM

## 2017-08-30 LAB — POCT INR: INR: 4.6

## 2017-08-30 NOTE — Patient Instructions (Signed)
Skip today, tomorrow only take 2.5mg   then continue same  dose of Coumadin 7.5mg  daily except 5mg  on Mondays, Wednesdays, and Fridays.  Recheck INR in 2 weeks.

## 2017-09-13 ENCOUNTER — Ambulatory Visit (INDEPENDENT_AMBULATORY_CARE_PROVIDER_SITE_OTHER): Payer: BLUE CROSS/BLUE SHIELD

## 2017-09-13 DIAGNOSIS — I4891 Unspecified atrial fibrillation: Secondary | ICD-10-CM

## 2017-09-13 DIAGNOSIS — Z952 Presence of prosthetic heart valve: Secondary | ICD-10-CM | POA: Diagnosis not present

## 2017-09-13 DIAGNOSIS — Z5181 Encounter for therapeutic drug level monitoring: Secondary | ICD-10-CM

## 2017-09-13 LAB — POCT INR: INR: 3.7

## 2017-09-13 NOTE — Patient Instructions (Signed)
Take 2.5mg  today, then resume same dosage 7.5mg  daily except 5mg  on Mondays, Wednesdays, and Fridays.  Recheck INR in 3 weeks.

## 2017-10-04 ENCOUNTER — Ambulatory Visit (INDEPENDENT_AMBULATORY_CARE_PROVIDER_SITE_OTHER): Payer: BLUE CROSS/BLUE SHIELD | Admitting: *Deleted

## 2017-10-04 DIAGNOSIS — Z952 Presence of prosthetic heart valve: Secondary | ICD-10-CM

## 2017-10-04 DIAGNOSIS — I4891 Unspecified atrial fibrillation: Secondary | ICD-10-CM

## 2017-10-04 DIAGNOSIS — Z5181 Encounter for therapeutic drug level monitoring: Secondary | ICD-10-CM

## 2017-10-04 LAB — POCT INR: INR: 3.5

## 2017-10-04 NOTE — Patient Instructions (Addendum)
  Description   Continue  same dosage of coumadin 7.5mg  daily except 5mg  on Mondays, Wednesdays, and Fridays.  Recheck INR in 4 weeks.  Order given to pt to have INR done in [place] on 11/01/2017 as he is moving there and will establish with cardiologist there

## 2017-10-19 ENCOUNTER — Telehealth: Payer: Self-pay | Admitting: Cardiology

## 2017-10-19 NOTE — Telephone Encounter (Signed)
Will forward for dr crenshaw review  

## 2017-10-19 NOTE — Telephone Encounter (Signed)
Pt is moving  Back to [place].He would like for Dr Jens Som to refer him to a Cardiologist in [place].He is close to [facility] in [place]

## 2017-10-19 NOTE — Telephone Encounter (Signed)
I don't know cardiologist in that area. Olga Millers

## 2017-10-19 NOTE — Telephone Encounter (Signed)
Spoke with pt, aware to have the new cardiologist send [place] a release and we will send his records.

## 2017-10-31 ENCOUNTER — Other Ambulatory Visit: Payer: Self-pay | Admitting: Family Medicine

## 2017-10-31 ENCOUNTER — Other Ambulatory Visit: Payer: Self-pay | Admitting: Cardiology

## 2017-11-14 LAB — PROTIME-INR: INR: 6.5 — AB (ref 0.9–1.1)

## 2017-11-15 ENCOUNTER — Telehealth: Payer: Self-pay | Admitting: Cardiology

## 2017-11-15 ENCOUNTER — Ambulatory Visit (INDEPENDENT_AMBULATORY_CARE_PROVIDER_SITE_OTHER): Payer: BLUE CROSS/BLUE SHIELD | Admitting: Cardiovascular Disease

## 2017-11-15 DIAGNOSIS — I482 Chronic atrial fibrillation: Secondary | ICD-10-CM

## 2017-11-15 DIAGNOSIS — Z952 Presence of prosthetic heart valve: Secondary | ICD-10-CM

## 2017-11-15 DIAGNOSIS — Z5181 Encounter for therapeutic drug level monitoring: Secondary | ICD-10-CM

## 2017-11-15 DIAGNOSIS — I4821 Permanent atrial fibrillation: Secondary | ICD-10-CM

## 2017-11-15 NOTE — Telephone Encounter (Signed)
Please refer to Anticoagulation Encounter for instructions.

## 2017-11-15 NOTE — Telephone Encounter (Signed)
Patient followed by [facility]

## 2017-11-15 NOTE — Telephone Encounter (Signed)
Spoke with Ian Bowen at [facility], patient INR 6.5 collected 11-14-17. Will forward to CV[facility].

## 2017-11-15 NOTE — Patient Instructions (Signed)
Description   Spoke with pt & instructed pt to hold today & tomorrow's dose then continue taking same dosage of coumadin 7.5mg  daily except 5mg  on Mondays, Wednesdays, and Fridays.  Recheck INR on Wednesday. Pt is aware he needs to find a Cardiologist in [place] as we can only follow for 30 days since he is living out of town.

## 2017-11-22 LAB — PROTIME-INR: INR: 1.9 — AB (ref 0.9–1.1)

## 2017-11-24 ENCOUNTER — Ambulatory Visit (INDEPENDENT_AMBULATORY_CARE_PROVIDER_SITE_OTHER): Payer: BLUE CROSS/BLUE SHIELD | Admitting: Internal Medicine

## 2017-11-24 DIAGNOSIS — Z952 Presence of prosthetic heart valve: Secondary | ICD-10-CM

## 2017-11-24 DIAGNOSIS — I482 Chronic atrial fibrillation: Secondary | ICD-10-CM

## 2017-11-24 DIAGNOSIS — I4821 Permanent atrial fibrillation: Secondary | ICD-10-CM

## 2017-11-24 DIAGNOSIS — Z5181 Encounter for therapeutic drug level monitoring: Secondary | ICD-10-CM | POA: Diagnosis not present

## 2017-11-24 NOTE — Patient Instructions (Signed)
Description   Spoke with pt & instructed pt to take 1 and 1/2 tablets (7.5mg )  today Feb 8th then  continue taking same dosage of coumadin 7.5mg  daily except 5mg  on Mondays, Wednesdays, and Fridays.Recheck INR in 2 weeks Pt is aware he needs to find a Cardiologist in [place] as we can only follow for 30 days since he is living out of town. States will try to find Cardiologist next week

## 2017-12-13 ENCOUNTER — Ambulatory Visit: Payer: Self-pay | Admitting: Cardiology

## 2017-12-13 DIAGNOSIS — I4821 Permanent atrial fibrillation: Secondary | ICD-10-CM

## 2017-12-13 DIAGNOSIS — Z5181 Encounter for therapeutic drug level monitoring: Secondary | ICD-10-CM

## 2017-12-13 DIAGNOSIS — Z952 Presence of prosthetic heart valve: Secondary | ICD-10-CM

## 2017-12-24 ENCOUNTER — Other Ambulatory Visit: Payer: Self-pay | Admitting: Cardiology
